# Patient Record
Sex: Male | Born: 1970 | Race: White | Hispanic: No | Marital: Single | Smoking: Never smoker
Health system: Southern US, Community
[De-identification: ages and names within clinical notes are randomized; demographics above are authoritative.]

## PROBLEM LIST (undated history)

## (undated) HISTORY — PX: APPENDECTOMY: SHX54

## (undated) HISTORY — PX: HERNIA REPAIR: SHX51

---

## 2009-09-13 HISTORY — PX: INGUINAL HERNIA REPAIR: SHX194

## 2011-09-16 ENCOUNTER — Emergency Department
Admission: EM | Admit: 2011-09-16 | Discharge: 2011-09-16 | Disposition: A | Discharge status: Home / Self Care | Payer: BC Managed Care – PPO | Source: Home / Self Care | Attending: Emergency Medicine | Admitting: Emergency Medicine

## 2011-09-16 ENCOUNTER — Encounter: Payer: Self-pay | Admitting: Emergency Medicine

## 2011-09-16 DIAGNOSIS — R0602 Shortness of breath: Secondary | ICD-10-CM

## 2011-09-16 DIAGNOSIS — J309 Allergic rhinitis, unspecified: Secondary | ICD-10-CM

## 2011-09-16 MED ORDER — ALBUTEROL SULFATE HFA 108 (90 BASE) MCG/ACT IN AERS: 1.0000 | INHALATION_SPRAY | Freq: Four times a day (QID) | RESPIRATORY_TRACT | Status: DC | PRN

## 2011-09-16 MED ORDER — PREDNISONE (PAK) 10 MG PO TABS: 10.0000 mg | ORAL_TABLET | Freq: Every day | ORAL | Status: AC

## 2011-09-16 MED ORDER — AZITHROMYCIN 250 MG PO TABS: ORAL_TABLET | ORAL | Status: AC

## 2011-09-16 NOTE — ED Notes (Signed)
SOB x 2days denies cough, cold does not smoke

## 2011-09-16 NOTE — ED Provider Notes (Addendum)
History     CSN: [id number]  Arrival date & time 09/16/11  1200   None     Chief Complaint  Patient presents with  . Shortness of Breath    (Consider location/radiation/quality/duration/timing/severity/associated sxs/prior treatment) HPI Johnny Ramirez is a 41 y.o. male who complains of onset of  shortness of breath for 2 days. He has a history of allergic rhinitis is not taking any medicine. He is not experiencing any type of chest pain, he is not a smoker, he does not have high blood pressure or any cardiac risk factors. He feels it is difficult to take a deep breath especially while laying down at night. Here in the office he is feeling pretty good.   No sore throat +/- cough No pleuritic pain No wheezing + nasal congestion + post-nasal drainage No sinus pain/pressure No chest congestion No itchy/red eyes No earache No hemoptysis + SOB No chills/sweats No fever No nausea No vomiting No abdominal pain No diarrhea No skin rashes No fatigue No myalgias No headache    History reviewed. No pertinent past medical history.  No past surgical history on file.  No family history on file.  History  Substance Use Topics  . Smoking status: Not on file  . Smokeless tobacco: Not on file  . Alcohol Use: Not on file    OB History    Grav Para Term Preterm Abortions TAB SAB Ect Mult Living                  Review of Systems  Allergies  Review of patient's allergies indicates no known allergies.  Home Medications   Current Outpatient Rx  Name Route Sig Dispense Refill  . ALBUTEROL SULFATE HFA 108 (90 BASE) MCG/ACT IN AERS Inhalation Inhale 1-2 puffs into the lungs every 6 (six) hours as needed for wheezing. 1 Inhaler 0  . AZITHROMYCIN 250 MG PO TABS  Use as directed 1 each 0  . PREDNISONE (PAK) 10 MG PO TABS Oral Take 1 tablet (10 mg total) by mouth daily. 6 day pack, use as directed, dispense 1 pack 1 tablet 0    BP 127/81  Pulse 70  Temp(Src) 98.7 F (37.1 C)  (Oral)  Resp 18  Ht 6' (1.829 m)  Wt 214 lb (97.07 kg)  BMI 29.02 kg/m2  SpO2 100%  Physical Exam  Nursing note and vitals reviewed. Constitutional: She is oriented to person, place, and time. She appears well-developed and well-nourished.  HENT:  Head: Normocephalic and atraumatic.  Right Ear: Tympanic membrane, external ear and ear canal normal.  Left Ear: Tympanic membrane, external ear and ear canal normal.  Nose: Mucosal edema and rhinorrhea present.  Mouth/Throat: Posterior oropharyngeal erythema (postnasal drip) present. No oropharyngeal exudate or posterior oropharyngeal edema.  Eyes: No scleral icterus.  Neck: Neck supple.  Cardiovascular: Regular rhythm and normal heart sounds.   Pulmonary/Chest: Effort normal and breath sounds normal. No respiratory distress.  Neurological: She is alert and oriented to person, place, and time.  Skin: Skin is warm and dry.  Psychiatric: She has a normal mood and affect. Her speech is normal.    ED Course  Procedures (including critical care time)  Labs Reviewed - No data to display No results found.   1. Shortness of breath   2. Allergic rhinitis, cause unspecified       MDM   Shortness of breath this and allergies. I do not see a cause of a cardiac or intrathoracic abnormalities go-ahead put him  on a prednisone pack as well as a Z-Pak for the next few days. I've also given a prescription for albuterol inhaler to use if needed he should also be taking some form of antihistamine for his allergies. If he is not improving or if he is getting any worse, the next step would be taking either chest x-ray and/or an EKG. He was given contact information for the primary care physician's next-door.  Lily Kocher, MD 09/16/11 1239  Lily Kocher, MD 09/16/11 2000

## 2011-09-27 ENCOUNTER — Emergency Department
Admission: EM | Admit: 2011-09-27 | Discharge: 2011-09-27 | Disposition: A | Discharge status: Home / Self Care | Payer: BC Managed Care – PPO | Source: Home / Self Care | Attending: Family Medicine | Admitting: Family Medicine

## 2011-09-27 ENCOUNTER — Emergency Department
Admit: 2011-09-27 | Discharge: 2011-09-27 | Disposition: A | Discharge status: Home / Self Care | Payer: BC Managed Care – PPO | Attending: Family Medicine | Admitting: Family Medicine

## 2011-09-27 DIAGNOSIS — M940 Chondrocostal junction syndrome [Tietze]: Secondary | ICD-10-CM

## 2011-09-27 NOTE — ED Provider Notes (Signed)
History     CSN: [id number]  Arrival date & time 09/27/11  1417   First MD Initiated Contact with Patient 09/27/11 1440      Chief Complaint  Patient presents with  . Shortness of Breath     HPI Comments: Patient reports that his sensation of dyspnea improved while taking prednisone, and now has recurred although not as severe as previously.  Interestingly, he notes that the sensation is not present when he is physically active.  He has had no recent cough or URI.  Over the past two days he has developed mild intermittent discomfort in his xiphoid area that is worse with movement and radiates to his back.  He recalls to trauma to his chest or significant change in physical activities.  Patient is a 41 y.o. male presenting with shortness of breath. The history is provided by the patient.  Shortness of Breath  The current episode started more than 2 weeks ago. The problem occurs occasionally. The problem has been gradually worsening. The problem is mild. Relieved by: activity. The symptoms are aggravated by nothing. Associated symptoms include shortness of breath. Pertinent negatives include no chest pain, no chest pressure, no orthopnea, no fever, no rhinorrhea, no sore throat, no stridor, no cough and no wheezing. There was no intake of a foreign body. She was not exposed to toxic fumes. Her past medical history does not include asthma, past wheezing, eczema or asthma in the family. She has been behaving normally.    No past medical history on file.  No past surgical history on file.  No family history on file.  History  Substance Use Topics  . Smoking status: Not on file  . Smokeless tobacco: Not on file  . Alcohol Use: Not on file    OB History    Grav Para Term Preterm Abortions TAB SAB Ect Mult Living                  Review of Systems  Constitutional: Negative for fever, chills, activity change, fatigue and unexpected weight change.  HENT: Negative for ear pain,  congestion, sore throat and rhinorrhea.   Eyes: Negative.   Respiratory: Positive for chest tightness and shortness of breath. Negative for cough, wheezing and stridor.   Cardiovascular: Negative for chest pain, palpitations, orthopnea and leg swelling.  Gastrointestinal: Negative.   Genitourinary: Negative.   Musculoskeletal: Negative.   Skin: Negative.   Neurological: Negative for headaches.    Allergies  Review of patient's allergies indicates not on file.  Home Medications   Current Outpatient Rx  Name Route Sig Dispense Refill  . ALBUTEROL SULFATE HFA 108 (90 BASE) MCG/ACT IN AERS Inhalation Inhale 1-2 puffs into the lungs every 6 (six) hours as needed for wheezing. 1 Inhaler 0  . PREDNISONE (PAK) 10 MG PO TABS Oral Take 1 tablet (10 mg total) by mouth daily. 6 day pack, use as directed, dispense 1 pack 1 tablet 0    BP 117/76  Pulse 75  Temp(Src) 98.6 F (37 C) (Oral)  Resp 16  Ht 6' (1.829 m)  Wt 210 lb (95.255 kg)  BMI 28.48 kg/m2  SpO2 99%  Physical Exam Nursing notes and Vital Signs reviewed. Appearance:  Patient appears healthy, stated age, and in no acute distress Eyes:  Pupils are equal, round, and reactive to light and accomodation.  Extraocular movement is intact.  Conjunctivae are not inflamed  Nose:  Mildly congested turbinates.  No sinus tenderness.   Pharynx:  Normal Neck:  Supple.  No adenopathy Lungs:  Clear to auscultation.  Breath sounds are equal.  Chest:  Mild tenderness over the xiphoid process, but no tenderness over sternum or pectoralis muscles. Heart:  Regular rate and rhythm without murmurs, rubs, or gallops.  Abdomen:  Nontender without masses or hepatosplenomegaly.  Bowel sounds are present.  No CVA or flank tenderness.  Extremities:  No edema.  No calf tenderness Skin:  No rash present.   ED Course  Procedures none  Labs Reviewed - No data to display Dg Chest 2 View  09/27/2011  *RADIOLOGY REPORT*  Clinical Data: Shortness of breath.   CHEST - 2 VIEW  Comparison: None.  Findings: Trachea is midline.  Heart size normal.  Minimal biapical pleural thickening.  Lungs are clear.  No pleural fluid.  IMPRESSION: No acute findings.  Original Report Authenticated By: Reyes Ivan, M.D.   EKG:  normal  1. Costochondritis, acute       MDM  Although chest is not tender to palpation today, suspect costochondritis partly improved with prednisone. May take Ibuprofen 200mg , 4 tabs every 8 hours with food for flareups.  Apply heating pad to sternum two or three times daily. Reassurance.  Given a Water quality scientist patient information and instruction sheet on topic costochondritis Followup with [facility] if not improving about two weeks.         Donna Christen, MD 09/27/11 [phone]

## 2011-09-27 NOTE — ED Notes (Signed)
Was here 2 weeks ago for same sx.  Was given anti biotics and steroids, was better while on the meds now "I feel like I can't get enough air"

## 2012-06-12 ENCOUNTER — Ambulatory Visit (INDEPENDENT_AMBULATORY_CARE_PROVIDER_SITE_OTHER): Payer: BC Managed Care – PPO | Admitting: Family Medicine

## 2012-06-12 ENCOUNTER — Encounter: Payer: Self-pay | Admitting: Family Medicine

## 2012-06-12 VITALS — BP 129/82 | HR 80 | Ht 72.0 in | Wt 215.0 lb

## 2012-06-12 DIAGNOSIS — Z0000 Encounter for general adult medical examination without abnormal findings: Secondary | ICD-10-CM

## 2012-06-12 NOTE — Patient Instructions (Signed)
Start a regular exercise program and make sure you are eating a healthy diet Try to eat 4 servings of dairy a day  Your vaccines are up to date.

## 2012-06-12 NOTE — Progress Notes (Signed)
Subjective:    Patient ID: Johnny Ramirez, male    DOB: Dec 09, 1970, 41 y.o.   MRN: [id number]  HPI Here to estab care.  No problems.  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  No regular exercise. No medical problems. Family history of diabetes. He has never had her cholesterol checked in his entire life. He says he would like to have a physical.   Review of Systems  Constitutional: Negative for fever, diaphoresis and unexpected weight change.  HENT: Negative for hearing loss, rhinorrhea, sneezing and tinnitus.   Eyes: Negative for visual disturbance.  Respiratory: Negative for cough and wheezing.   Cardiovascular: Negative for chest pain and palpitations.  Gastrointestinal: Negative for nausea, vomiting, diarrhea and blood in stool.  Genitourinary: Negative for dysuria and discharge.  Musculoskeletal: Negative for myalgias and arthralgias.  Skin: Negative for rash.  Neurological: Negative for headaches.  Hematological: Negative for adenopathy.  Psychiatric/Behavioral: Negative for disturbed wake/sleep cycle and dysphoric mood. The patient is not nervous/anxious.    BP 129/82  Pulse 80  Wt 215 lb (97.523 kg)    No Known Allergies  History reviewed. No pertinent past medical history.  Past Surgical History  Procedure Date  . Appendectomy     age 41  . Inguinal hernia repair     age 72    History   Social History  . Marital Status: N/A    Spouse Name: Isabelle Course     Number of Children: 3  . Years of Education: N/A   Occupational History  . Mechanic     [address]   Social History Main Topics  . Smoking status: Never Smoker   . Smokeless tobacco: Not on file  . Alcohol Use: 1.8 oz/week    3 Cans of beer per week  . Drug Use: No  . Sexually Active: Yes   Other Topics Concern  . Not on file   Social History Narrative   2 CAFFEINE per day.  No exercise.     Family History  Problem Relation Age of Onset  . Alcohol abuse Father   . Diabetes Father   . Bone cancer Father     . Heart disease Father     Outpatient Encounter Prescriptions as of 06/12/2012  Medication Sig Dispense Refill  . Multiple Vitamins-Minerals (MENS MULTI VITAMIN & MINERAL PO) Take 1 tablet by mouth daily.              Objective:   Physical Exam  Constitutional: He is oriented to person, place, and time. He appears well-developed and well-nourished.  HENT:  Head: Normocephalic and atraumatic.  Right Ear: External ear normal.  Left Ear: External ear normal.  Nose: Nose normal.  Mouth/Throat: Oropharynx is clear and moist.  Eyes: Conjunctivae normal and EOM are normal. Pupils are equal, round, and reactive to light.  Neck: Normal range of motion. Neck supple. No thyromegaly present.  Cardiovascular: Normal rate, regular rhythm, normal heart sounds and intact distal pulses.        RAdial 2+. No carotid bruits.   Pulmonary/Chest: Effort normal and breath sounds normal.  Abdominal: Soft. Bowel sounds are normal. He exhibits no distension and no mass. There is no tenderness. There is no rebound and no guarding.  Musculoskeletal: Normal range of motion.  Lymphadenopathy:    He has no cervical adenopathy.  Neurological: He is alert and oriented to person, place, and time. He has normal reflexes.  Skin: Skin is warm and dry.  Psychiatric: He has a  normal mood and affect. His behavior is normal. Judgment and thought content normal.          Assessment & Plan:  CPE -  Start a regular exercise program and make sure you are eating a healthy diet Try to eat 4 servings of dairy a day or take a calcium supplement (500mg  twice a day). Your vaccines are up to date.   Will do labwork for CMP and lipids.    Decline flu vaccine.

## 2012-06-13 ENCOUNTER — Encounter: Payer: Self-pay | Admitting: Emergency Medicine

## 2012-06-19 LAB — COMPLETE METABOLIC PANEL WITH GFR
ALT: 14 U/L (ref 0–53)
AST: 15 U/L (ref 0–37)
Alkaline Phosphatase: 51 U/L (ref 39–117)
Creat: 1.19 mg/dL (ref 0.50–1.35)
GFR, Est African American: 88 mL/min
Sodium: 140 mEq/L (ref 135–145)
Total Bilirubin: 0.6 mg/dL (ref 0.3–1.2)
Total Protein: 7.4 g/dL (ref 6.0–8.3)

## 2012-06-19 LAB — LIPID PANEL
HDL: 53 mg/dL (ref >39)
LDL Cholesterol: 100 mg/dL — ABNORMAL HIGH (ref 0–99)
Total CHOL/HDL Ratio: 3.2 Ratio
Triglycerides: 83 mg/dL (ref <150)
VLDL: 17 mg/dL (ref 0–40)

## 2012-06-19 NOTE — Progress Notes (Signed)
Quick Note:  All labs are normal. ______ 

## 2012-07-14 ENCOUNTER — Encounter: Payer: Self-pay | Admitting: Family Medicine

## 2013-07-23 ENCOUNTER — Ambulatory Visit (INDEPENDENT_AMBULATORY_CARE_PROVIDER_SITE_OTHER): Payer: BC Managed Care – PPO | Admitting: Family Medicine

## 2013-07-23 ENCOUNTER — Encounter: Payer: Self-pay | Admitting: Family Medicine

## 2013-07-23 VITALS — BP 118/78 | HR 71 | Ht 72.0 in | Wt 216.0 lb

## 2013-07-23 DIAGNOSIS — K219 Gastro-esophageal reflux disease without esophagitis: Secondary | ICD-10-CM

## 2013-07-23 DIAGNOSIS — Z23 Encounter for immunization: Secondary | ICD-10-CM

## 2013-07-23 DIAGNOSIS — Z0000 Encounter for general adult medical examination without abnormal findings: Secondary | ICD-10-CM

## 2013-07-23 DIAGNOSIS — R632 Polyphagia: Secondary | ICD-10-CM

## 2013-07-23 NOTE — Progress Notes (Signed)
Subjective:    Patient ID: Johnny Ramirez, male    DOB: Oct 02, 1970, 42 y.o.   MRN: [id number]  HPI  Increased apetite. Has gained some weigh. He is working all the time.  No snoring. Sleeps ok but wakes up frequently.  Has been having some neck pain.  No fam history of DM or thyroid .  No inc thirst or urination.  No depressive sxs.   Review of Systems Conference review systems is negative.  BP 118/78  Pulse 71  Ht 6' (1.829 m)  Wt 216 lb (97.977 kg)  BMI 29.29 kg/m2    No Known Allergies  No past medical history on file.  Past Surgical History  Procedure Laterality Date  . Hernia repair      as a child  . Inguinal hernia repair  2011  . Appendectomy      as a child    History   Social History  . Marital Status: Single    Spouse Name: N/A    Number of Children: N/A  . Years of Education: N/A   Occupational History  . Not on file.   Social History Main Topics  . Smoking status: Never Smoker   . Smokeless tobacco: Not on file  . Alcohol Use: 1.0 oz/week    2 drink(s) per week  . Drug Use: No  . Sexual Activity: Not on file   Other Topics Concern  . Not on file   Social History Narrative   No regular exercise.         Family History  Problem Relation Age of Onset  . Diabetes    . Cancer Father 77    Outpatient Encounter Prescriptions as of 07/23/2013  Medication Sig  . Multiple Vitamins-Minerals (MENS MULTI VITAMIN & MINERAL PO) Take 1 tablet by mouth daily.  . [DISCONTINUED] albuterol (PROVENTIL HFA;VENTOLIN HFA) 108 (90 BASE) MCG/ACT inhaler Inhale 1-2 puffs into the lungs every 6 (six) hours as needed for wheezing.          Objective:   Physical Exam  Constitutional: He is oriented to person, place, and time. He appears well-developed and well-nourished.  HENT:  Head: Normocephalic and atraumatic.  Right Ear: External ear normal.  Left Ear: External ear normal.  Nose: Nose normal.  Mouth/Throat: Oropharynx is clear and moist.  Eyes:  Conjunctivae and EOM are normal. Pupils are equal, round, and reactive to light.  Neck: Normal range of motion. Neck supple. No thyromegaly present.  Cardiovascular: Normal rate, regular rhythm, normal heart sounds and intact distal pulses.   Pulmonary/Chest: Effort normal and breath sounds normal.  Abdominal: Soft. Bowel sounds are normal. He exhibits no distension and no mass. There is no tenderness. There is no rebound and no guarding.  Musculoskeletal: Normal range of motion.  Lymphadenopathy:    He has no cervical adenopathy.  Neurological: He is alert and oriented to person, place, and time. He has normal reflexes.  Skin: Skin is warm and dry.  Psychiatric: He has a normal mood and affect. His behavior is normal. Judgment and thought content normal.          Assessment & Plan:  CPE - Keep up a regular exercise program and make sure you are eating a healthy diet Try to eat 4 servings of dairy a day, or if you are lactose intolerant take a calcium with vitamin D daily.  Your vaccines are up to date.   Inc appetite. Unclear etiology. We'll check for anemia, thyroid problems. We'll  also do a fasting glucose to rule out diabetes. Discusses sometimes increase the intake is related to wanting more energy and becomes a cycle.  GERD-he's been using Nexium occasionally. We discussed reflux diet. Handout given. If symptoms persist then please let me know. Can also use antacids for acute relief.

## 2013-07-23 NOTE — Patient Instructions (Signed)

## 2013-07-24 LAB — COMPLETE METABOLIC PANEL WITH GFR
ALT: 20 U/L (ref 0–53)
Alkaline Phosphatase: 56 U/L (ref 39–117)
CO2: 29 mEq/L (ref 19–32)
Potassium: 4.3 mEq/L (ref 3.5–5.3)
Sodium: 139 mEq/L (ref 135–145)
Total Bilirubin: 0.8 mg/dL (ref 0.3–1.2)
Total Protein: 7.9 g/dL (ref 6.0–8.3)

## 2013-07-24 LAB — CBC WITH DIFFERENTIAL/PLATELET
Basophils Absolute: 0.1 10*3/uL (ref 0.0–0.1)
Basophils Relative: 1 % (ref 0–1)
MCHC: 35.2 g/dL (ref 30.0–36.0)
Neutro Abs: 5.3 10*3/uL (ref 1.7–7.7)
Neutrophils Relative %: 58 % (ref 43–77)
Platelets: 306 10*3/uL (ref 150–400)
RDW: 13 % (ref 11.5–15.5)

## 2013-07-24 LAB — LIPID PANEL
HDL: 48 mg/dL (ref >39)
LDL Cholesterol: 96 mg/dL (ref 0–99)
Total CHOL/HDL Ratio: 3.4 Ratio

## 2013-07-24 LAB — TSH: TSH: 2.398 u[IU]/mL (ref 0.350–4.500)

## 2013-07-24 NOTE — Progress Notes (Signed)
Quick Note:  All labs are normal. ______ 

## 2013-09-30 ENCOUNTER — Emergency Department
Admission: EM | Admit: 2013-09-30 | Discharge: 2013-09-30 | Disposition: A | Discharge status: Home / Self Care | Payer: BC Managed Care – PPO | Source: Home / Self Care | Attending: Family Medicine | Admitting: Family Medicine

## 2013-09-30 ENCOUNTER — Encounter: Payer: Self-pay | Admitting: Emergency Medicine

## 2013-09-30 DIAGNOSIS — R0981 Nasal congestion: Secondary | ICD-10-CM

## 2013-09-30 DIAGNOSIS — J3489 Other specified disorders of nose and nasal sinuses: Secondary | ICD-10-CM

## 2013-09-30 MED ORDER — PREDNISONE 20 MG PO TABS: 20.0000 mg | ORAL_TABLET | Freq: Two times a day (BID) | ORAL | Status: DC

## 2013-09-30 MED ORDER — FLUTICASONE PROPIONATE 50 MCG/ACT NA SUSP: NASAL | Status: DC

## 2013-09-30 MED ORDER — AZITHROMYCIN 250 MG PO TABS: ORAL_TABLET | ORAL | Status: DC

## 2013-09-30 NOTE — ED Provider Notes (Signed)
CSN: [id number]     Arrival date & time 09/30/13  1102 History   First MD Initiated Contact with Patient 09/30/13 1116     Chief Complaint  Patient presents with  . Facial Pain    x 2 weeks     HPI Comments: Patient complains of sinus congestion and left facial and forehead pressure for about two weeks.  He has had increased post-nasal drainage.  No fevers, chills, and sweats.  He has a history of seasonal allergies that usually flare up in the spring.  He does not feel ill, and has no URI symptoms.  The history is provided by the patient.    History reviewed. No pertinent past medical history. Past Surgical History  Procedure Laterality Date  . Hernia repair      as a child  . Inguinal hernia repair  2011  . Appendectomy      as a child   Family History  Problem Relation Age of Onset  . Diabetes    . Cancer Father 11   History  Substance Use Topics  . Smoking status: Never Smoker   . Smokeless tobacco: Not on file  . Alcohol Use: 1.0 oz/week    2 drink(s) per week    Review of Systems No sore throat No cough No pleuritic pain No wheezing + nasal congestion + post-nasal drainage + sinus pain/pressure No itchy/red eyes No earache No hemoptysis No SOB No fever/chills No nausea No vomiting No abdominal pain No diarrhea No urinary symptoms No skin rash No fatigue No myalgias No headache Used OTC meds without relief  Allergies  Review of patient's allergies indicates no known allergies.  Home Medications   Current Outpatient Rx  Name  Route  Sig  Dispense  Refill  . Multiple Vitamins-Minerals (MENS MULTI VITAMIN & MINERAL PO)   Oral   Take 1 tablet by mouth daily.         Marland Kitchen azithromycin (ZITHROMAX Z-PAK) 250 MG tablet      Take 2 tabs today; then begin one tab once daily for 4 more days. (Rx void after 10/08/13)   6 each   0   . fluticasone (FLONASE) 50 MCG/ACT nasal spray      Place two sprays in each nostril once daily   16 g   1   .  predniSONE (DELTASONE) 20 MG tablet   Oral   Take 1 tablet (20 mg total) by mouth 2 (two) times daily. Take with food.   10 tablet   0    BP 111/74  Pulse 82  Temp(Src) 98.4 F (36.9 C) (Oral)  Ht 6' (1.829 m)  Wt 227 lb (102.967 kg)  BMI 30.78 kg/m2  SpO2 98% Physical Exam Nursing notes and Vital Signs reviewed. Appearance:  Patient appears healthy, stated age, and in no acute distress Eyes:  Pupils are equal, round, and reactive to light and accomodation.  Extraocular movement is intact.  Conjunctivae are not inflamed  Ears:  Canals normal.  Tympanic membranes normal.  Nose:  Congested turbinates.  No sinus tenderness.   Pharynx:  Normal; post-nasal drainage is noted Neck:  Supple.  No adenopathy Lungs:  Clear to auscultation.  Breath sounds are equal.  Heart:  Regular rate and rhythm without murmurs, rubs, or gallops.  Abdomen:  Nontender without masses or hepatosplenomegaly.  Bowel sounds are present.  No CVA or flank tenderness.  Extremities:  No edema.  Skin:  No rash present.   ED Course  Procedures  none       MDM   1. Nasal sinus congestion    Begin prednisone burst.  Begin Flonase nasal spray. Take plain Mucinex (1200 mg guaifenesin) twice daily for cough and congestion.  May add Sudafed for sinus congestion.   Increase fluid intake, rest. May use Afrin nasal spray (or generic oxymetazoline) twice daily for about 5 days.  Also recommend using saline nasal spray several times daily and saline nasal irrigation (AYR is a common brand).  Use Flonase spray after Afrin spray and saline rinse Begin Azithromycin if not improving about one week or if fever develops (Given a prescription to hold, with an expiration date)  Followup with Family Doctor if not improved in two weeks        Kandra Nicolas, MD 10/03/13 1038

## 2013-09-30 NOTE — Discharge Instructions (Signed)
Take plain Mucinex (1200 mg guaifenesin) twice daily for cough and congestion.  May add Sudafed for sinus congestion.   Increase fluid intake, rest. May use Afrin nasal spray (or generic oxymetazoline) twice daily for about 5 days.  Also recommend using saline nasal spray several times daily and saline nasal irrigation (AYR is a common brand).  Use Flonase spray after Afrin spray and saline rinse Begin Azithromycin if not improving about one week or if fever develops

## 2013-09-30 NOTE — ED Notes (Signed)
Arif complains of pressure in his face for 2 weeks. Denies cough, fevers, chills or sweats.

## 2014-06-03 ENCOUNTER — Encounter: Payer: Self-pay | Admitting: Physician Assistant

## 2014-06-03 ENCOUNTER — Ambulatory Visit (INDEPENDENT_AMBULATORY_CARE_PROVIDER_SITE_OTHER): Payer: BC Managed Care – PPO | Admitting: Physician Assistant

## 2014-06-03 VITALS — BP 137/69 | HR 94 | Ht 72.0 in | Wt 224.0 lb

## 2014-06-03 DIAGNOSIS — N509 Disorder of male genital organs, unspecified: Secondary | ICD-10-CM | POA: Diagnosis not present

## 2014-06-03 DIAGNOSIS — N50811 Right testicular pain: Secondary | ICD-10-CM

## 2014-06-03 NOTE — Progress Notes (Signed)
   Subjective:    Patient ID: Johnny Ramirez, male    DOB: 1970-09-19, 43 y.o.   MRN: [id number]  HPI Patient is a 43 yo male who presents to the clinic with on and off testicular pain for about 2 months. Pain is not bad but just annoying and will not go away. Denies any swelling, pain to touch, difficultly urinating, pain with sex, nausea or vomiting. Pain is present when pants pull to tight or just sitting the wrong way. No pain with urination. Seems like it is more right testicle than left. No pain with lifting or coughing. Not tried anything to make better. Hx of inguinal hernia bilateral but has had no pressure sensation as before. No penile discharge. No fever, chills, n/v/d.   Pt is sexually active with one person.      Review of Systems  All other systems reviewed and are negative.      Objective:   Physical Exam  Genitourinary: Penis normal.             Assessment & Plan:  Right sided testicular pain- reassured pt that did not appear to be anything structural. There was a small area posterior right testicle of varicosities.. Discussed normal finding and usually do not result in pain. No signs of hernia. No masses, tenderness. No signs of infection. Not cause identified today. Will get ultrasound. If normal suggest wear looser pants and/or consider boxers for looser fit.

## 2014-06-03 NOTE — Patient Instructions (Signed)
Varicocele A varicocele is a swelling of veins in the scrotum (the bag of skin that contains the testicles). It is most common in young men. It occurs most often on the left side. Small or painless varicoceles do not need treatment. Most often, this is not a serious problem, but further tests may be needed to confirm the diagnosis. Surgery may be needed if complications of varicoceles arise. Rarely, varicoceles can reoccur after surgery. CAUSES  The swelling is due to blood backing up in the vein that leads from the testicle back to the body. Blood backs up because the valves inside the vein are not working properly. Veins normally return blood to the heart. Valves in veins are supposed to be one-way valves. They should not allow blood to flow backwards. If the valves do not work well, blood can pool in a vein and make it swell. The same thing happens with varicose veins in the leg. SYMPTOMS  A varicocele most often causes no symptoms. When they occur, symptoms include:   Swelling on one side of the scrotum.  Swelling that is more obvious when standing up.  A lumpy feeling in the scrotum.  Heaviness on one side of the scrotum.  Dull ache in the scrotum, especially after exercise or prolonged standing or sitting.  Slower growth or reduced size of the testicle on the side of the varicocele (in young males).  Problems with fertility can arise if the testicle does not grow normally. DIAGNOSIS  Varicocele is usually diagnosed by a physical exam. Sometimes ultrasonography is done. TREATMENT  Usually, varicoceles need no treatment. They are often routinely monitored on exam by your caregiver to ensure they do not slow the growth of the testicle on that side. Treatment may be needed if:  The varicocele is large.  There is a lot of pain.  The varicocele causes a decrease in the size of the testicle in a growing adolescent.  The other testicle is absent or not normal.  Varicoceles are found on  both sides of the scrotum.  There is pain when exercising.  There are fertility problems. There are two types of treatment:  Surgery. The surgeon ties off the swollen veins. Surgery may be done with an incision in the skin or through a laparoscope. The surgery is usually done in an outpatient setting. Outpatient means there is no overnight stay in a hospital.  Embolization. A small tube is placed in a vein and guided into the swollen veins. X-rays are used to guide the small tube. Tiny metal coils or other blocking items are put through the tube. This blocks swollen veins and the flow of blood. This is usually done in an outpatient setting without the use of general anesthesia. HOME CARE INSTRUCTIONS  To decrease discomfort:  Wear supportive underwear.  Use an athletic supporter for sports.  Only take over-the-counter or prescription medicines for pain or discomfort as directed by your caregiver. SEEK MEDICAL CARE IF:   Pain is increasing.  Swelling does not decrease when lying down.  Testicle is smaller.  The testicle becomes enlarged, swollen, red, or painful. Document Released: 12/06/2000 Document Revised: 11/22/2011 Document Reviewed: 12/10/2009 ExitCare Patient Information 2015 [employer]. This information is not intended to replace advice given to you by your health care provider. Make sure you discuss any questions you have with your health care provider.  

## 2014-06-10 ENCOUNTER — Ambulatory Visit (INDEPENDENT_AMBULATORY_CARE_PROVIDER_SITE_OTHER): Payer: BC Managed Care – PPO

## 2014-06-10 ENCOUNTER — Other Ambulatory Visit: Payer: Self-pay | Admitting: Physician Assistant

## 2014-06-10 DIAGNOSIS — N433 Hydrocele, unspecified: Secondary | ICD-10-CM

## 2014-06-10 DIAGNOSIS — I861 Scrotal varices: Secondary | ICD-10-CM

## 2014-06-10 DIAGNOSIS — N50819 Testicular pain, unspecified: Secondary | ICD-10-CM

## 2014-06-10 DIAGNOSIS — N50811 Right testicular pain: Secondary | ICD-10-CM

## 2014-06-10 HISTORY — DX: Scrotal varices: I86.1

## 2015-09-11 ENCOUNTER — Encounter: Payer: Self-pay | Admitting: Family Medicine

## 2015-09-19 ENCOUNTER — Encounter: Payer: Self-pay | Admitting: Family Medicine

## 2015-09-19 ENCOUNTER — Ambulatory Visit (INDEPENDENT_AMBULATORY_CARE_PROVIDER_SITE_OTHER): Payer: BLUE CROSS/BLUE SHIELD | Admitting: Family Medicine

## 2015-09-19 VITALS — BP 131/72 | HR 69 | Temp 98.3°F | Resp 18 | Wt 231.7 lb

## 2015-09-19 DIAGNOSIS — R5383 Other fatigue: Secondary | ICD-10-CM

## 2015-09-19 DIAGNOSIS — G4700 Insomnia, unspecified: Secondary | ICD-10-CM

## 2015-09-19 DIAGNOSIS — Z0189 Encounter for other specified special examinations: Secondary | ICD-10-CM | POA: Diagnosis not present

## 2015-09-19 DIAGNOSIS — Z0000 Encounter for general adult medical examination without abnormal findings: Secondary | ICD-10-CM

## 2015-09-19 LAB — CBC WITH DIFFERENTIAL/PLATELET
BASOS PCT: 1 % (ref 0–1)
Basophils Absolute: 0.1 10*3/uL (ref 0.0–0.1)
EOS ABS: 0.1 10*3/uL (ref 0.0–0.7)
Eosinophils Relative: 1 % (ref 0–5)
HCT: 43.6 % (ref 39.0–52.0)
Hemoglobin: 14.9 g/dL (ref 13.0–17.0)
Lymphocytes Relative: 38 % (ref 12–46)
Lymphs Abs: 2.1 10*3/uL (ref 0.7–4.0)
MCH: 30.7 pg (ref 26.0–34.0)
MCHC: 34.2 g/dL (ref 30.0–36.0)
MCV: 89.7 fL (ref 78.0–100.0)
MONO ABS: 0.6 10*3/uL (ref 0.1–1.0)
MONOS PCT: 11 % (ref 3–12)
MPV: 10.3 fL (ref 8.6–12.4)
NEUTROS PCT: 49 % (ref 43–77)
Neutro Abs: 2.7 10*3/uL (ref 1.7–7.7)
PLATELETS: 270 10*3/uL (ref 150–400)
RBC: 4.86 MIL/uL (ref 4.22–5.81)
RDW: 12.9 % (ref 11.5–15.5)
WBC: 5.6 10*3/uL (ref 4.0–10.5)

## 2015-09-19 LAB — COMPLETE METABOLIC PANEL WITH GFR
ALBUMIN: 4.6 g/dL (ref 3.6–5.1)
ALK PHOS: 55 U/L (ref 40–115)
ALT: 27 U/L (ref 9–46)
AST: 21 U/L (ref 10–40)
BUN: 16 mg/dL (ref 7–25)
CO2: 27 mmol/L (ref 20–31)
Calcium: 9.4 mg/dL (ref 8.6–10.3)
Chloride: 103 mmol/L (ref 98–110)
Creat: 1.21 mg/dL (ref 0.60–1.35)
GFR, Est African American: 84 mL/min (ref >=60)
GFR, Est Non African American: 73 mL/min (ref >=60)
GLUCOSE: 99 mg/dL (ref 65–99)
POTASSIUM: 4.2 mmol/L (ref 3.5–5.3)
SODIUM: 139 mmol/L (ref 135–146)
TOTAL PROTEIN: 7.5 g/dL (ref 6.1–8.1)
Total Bilirubin: 0.7 mg/dL (ref 0.2–1.2)

## 2015-09-19 LAB — LIPID PANEL
CHOL/HDL RATIO: 3.3 ratio (ref <=5.0)
Cholesterol: 158 mg/dL (ref 125–200)
HDL: 48 mg/dL (ref >=40)
LDL Cholesterol: 101 mg/dL (ref <130)
Triglycerides: 44 mg/dL (ref <150)
VLDL: 9 mg/dL (ref <30)

## 2015-09-19 LAB — FERRITIN: FERRITIN: 201 ng/mL (ref 22–322)

## 2015-09-19 LAB — VITAMIN B12: VITAMIN B 12: 596 pg/mL (ref 211–911)

## 2015-09-19 LAB — TSH: TSH: 1.67 u[IU]/mL (ref 0.350–4.500)

## 2015-09-19 NOTE — Progress Notes (Signed)
Subjective:    Patient ID: Johnny Ramirez, male    DOB: 18-Sep-1970, 45 y.o.   MRN: [id number]  HPI  45 year old otherwise healthy male on no medications comes in today for complete physical exam. His job transferred him to [place] so he's been commuting an hour and a half each way and has been working 12 hour shifts. He typically gets up at 4:30 in the morning. Concerns today. He has not been exercising because of his work schedule.  For the last 6-7 months has felt more fatigued and more hungry. Having some sleep issues. Can fall asleep well but hard time staying asleep.  No night sweats or fever or chills.  No fam hx of thryoid.  No special diet. No hx of anemia. No blood loss. No blood in the stool. No swollen glands.   Review of Systems Conference of review of systems is negative except for those listed in the history of present illness.  BP 131/72 mmHg  Pulse 69  Temp(Src) 98.3 F (36.8 C) (Oral)  Resp 18  Wt 231 lb 11.2 oz (105.098 kg)  SpO2 100%    No Known Allergies  No past medical history on file.  Past Surgical History  Procedure Laterality Date  . Hernia repair      as a child  . Inguinal hernia repair  2011  . Appendectomy      as a child    Social History   Social History  . Marital Status: Single    Spouse Name: N/A  . Number of Children: N/A  . Years of Education: N/A   Occupational History  . Not on file.   Social History Main Topics  . Smoking status: Never Smoker   . Smokeless tobacco: Not on file  . Alcohol Use: 1.0 oz/week    2 drink(s) per week  . Drug Use: No  . Sexual Activity: Not on file   Other Topics Concern  . Not on file   Social History Narrative   No regular exercise.         Family History  Problem Relation Age of Onset  . Diabetes    . Cancer Father 82    Outpatient Encounter Prescriptions as of 09/19/2015  Medication Sig  . Multiple Vitamins-Minerals (MENS MULTI VITAMIN & MINERAL PO) Take 1 tablet by mouth  daily.   No facility-administered encounter medications on file as of 09/19/2015.          Objective:   Physical Exam  Constitutional: He is oriented to person, place, and time. He appears well-developed and well-nourished.  HENT:  Head: Normocephalic and atraumatic.  Right Ear: External ear normal.  Left Ear: External ear normal.  Nose: Nose normal.  Mouth/Throat: Oropharynx is clear and moist.  TMs and canals are clear.   Eyes: Conjunctivae and EOM are normal. Pupils are equal, round, and reactive to light.  Neck: Normal range of motion. Neck supple. No thyromegaly present.  Cardiovascular: Normal rate, regular rhythm, normal heart sounds and intact distal pulses.   No carotid bruits.   Pulmonary/Chest: Effort normal and breath sounds normal.  Abdominal: Soft. Bowel sounds are normal. He exhibits no distension and no mass. There is no tenderness. There is no rebound and no guarding.  Musculoskeletal: Normal range of motion.  Lymphadenopathy:    He has no cervical adenopathy.  Neurological: He is alert and oriented to person, place, and time. He has normal reflexes.  Skin: Skin is warm and dry.  Psychiatric:  He has a normal mood and affect. His behavior is normal. Judgment and thought content normal.          Assessment & Plan:  CPE Keep up a regular exercise program and make sure you are eating a healthy diet Try to eat 4 servings of dairy a day, or if you are lactose intolerant take a calcium with vitamin D daily.  Your vaccines are up to date.   Fatigue-it sounds like this may be related to recent change in sleep quality. We discussed sleep hygiene. He is not interested in taking any medications including over-the-counter medications as he's worried about sedation as he does have to drive for now half in the morning. We did discuss regular exercise to help with energy levels and to help reduce his weight. He has gained a few pounds in the last year as well. We'll just do  additional blood work to include thyroid and evaluation for anemia.  Insomnia-see note above. Discussed sleep hygiene.

## 2016-04-23 ENCOUNTER — Emergency Department
Admission: EM | Admit: 2016-04-23 | Discharge: 2016-04-23 | Disposition: A | Discharge status: Home / Self Care | Payer: BLUE CROSS/BLUE SHIELD | Source: Home / Self Care | Attending: Family Medicine | Admitting: Family Medicine

## 2016-04-23 ENCOUNTER — Emergency Department (INDEPENDENT_AMBULATORY_CARE_PROVIDER_SITE_OTHER): Payer: BLUE CROSS/BLUE SHIELD

## 2016-04-23 ENCOUNTER — Encounter: Payer: Self-pay | Admitting: Emergency Medicine

## 2016-04-23 DIAGNOSIS — R42 Dizziness and giddiness: Secondary | ICD-10-CM | POA: Diagnosis not present

## 2016-04-23 DIAGNOSIS — R0602 Shortness of breath: Secondary | ICD-10-CM | POA: Diagnosis not present

## 2016-04-23 MED ORDER — PREDNISONE 20 MG PO TABS: ORAL_TABLET | ORAL | 0 refills | Status: DC

## 2016-04-23 MED ORDER — ALBUTEROL SULFATE HFA 108 (90 BASE) MCG/ACT IN AERS: 1.0000 | INHALATION_SPRAY | Freq: Four times a day (QID) | RESPIRATORY_TRACT | 0 refills | Status: DC | PRN

## 2016-04-23 NOTE — ED Provider Notes (Signed)
CSN: [id number]     Arrival date & time 04/23/16  1412 History   First MD Initiated Contact with Patient 04/23/16 1423     Chief Complaint  Patient presents with  . Dizziness   (Consider location/radiation/quality/duration/timing/severity/associated sxs/prior Treatment) HPI  Johnny Ramirez is a 45 y.o. male presenting to UC with c/o intermittent episodes of dizziness with associated SOB and chest tightness for about 1 week.  Episodes last up to 20 minutes at a time.  Rest and slow deep breaths help with symptoms. No known triggers.  He reports occasionally feeling lightheaded and states "feels like bricks on my chest at times." no hx of CAD. Denies hx of asthma. No hx of anxiety. No recent URI. Denies cough or congestion. Denies hx of blood clots. No leg pain or swelling. No recent head injuries. No nausea or vomiting.   Per medical records, pt had hx of SOB in 2013, was treated with prednisone, azithromycin, and albuterol, symptoms initially resolved but then developed into costochondritis.  Pt has not had similar symptoms since. He has not tried anything for current symptoms as they are intermittent.  Denise chest pain, SOB, or dizziness at this time but did have symptoms earlier this morning. Pt reports normal annual exam in January of this year.    History reviewed. No pertinent past medical history. Past Surgical History:  Procedure Laterality Date  . APPENDECTOMY     as a child  . HERNIA REPAIR     as a child  . INGUINAL HERNIA REPAIR  2011   Family History  Problem Relation Age of Onset  . Diabetes    . Cancer Father 14   Social History  Substance Use Topics  . Smoking status: Never Smoker  . Smokeless tobacco: Never Used  . Alcohol use 4.8 oz/week    8 Standard drinks or equivalent per week    Review of Systems  Constitutional: Negative for chills, diaphoresis, fatigue and fever.  HENT: Negative for congestion, ear pain, sore throat, trouble swallowing and voice change.    Respiratory: Positive for chest tightness and shortness of breath. Negative for cough.   Cardiovascular: Negative for chest pain and palpitations.  Gastrointestinal: Negative for abdominal pain, diarrhea, nausea and vomiting.  Musculoskeletal: Negative for arthralgias, back pain, myalgias, neck pain and neck stiffness.  Skin: Negative for rash.  Neurological: Positive for dizziness and light-headedness. Negative for syncope, weakness and headaches.  All other systems reviewed and are negative.   Allergies  Review of patient's allergies indicates no known allergies.  Home Medications   Prior to Admission medications   Medication Sig Start Date End Date Taking? Authorizing Provider  albuterol (PROVENTIL HFA;VENTOLIN HFA) 108 (90 Base) MCG/ACT inhaler Inhale 1-2 puffs into the lungs every 6 (six) hours as needed for wheezing or shortness of breath. 04/23/16   Noland Fordyce, PA-C  Multiple Vitamins-Minerals (MENS MULTI VITAMIN & MINERAL PO) Take 1 tablet by mouth daily.    Historical Provider, MD  predniSONE (DELTASONE) 20 MG tablet 3 tabs po day one, then 2 po daily x 4 days 04/23/16   Noland Fordyce, PA-C   Meds Ordered and Administered this Visit  Medications - No data to display  BP 115/72 (BP Location: Right Arm)   Pulse 79   Temp 98.4 F (36.9 C) (Oral)   SpO2 98%  No data found.   Physical Exam  Constitutional: He is oriented to person, place, and time. He appears well-developed and well-nourished.  HENT:  Head: Normocephalic  and atraumatic.  Right Ear: Tympanic membrane normal. No middle ear effusion.  Left Ear: Tympanic membrane normal.  No middle ear effusion.  Nose: Nose normal.  Mouth/Throat: Uvula is midline, oropharynx is clear and moist and mucous membranes are normal.  Eyes: Conjunctivae and EOM are normal. Pupils are equal, round, and reactive to light. No scleral icterus.  Neck: Normal range of motion. Neck supple.  Cardiovascular: Normal rate, regular rhythm and  normal heart sounds.   Pulmonary/Chest: Effort normal and breath sounds normal. No respiratory distress. He has no wheezes. He has no rales. He exhibits no tenderness.  No respiratory distress, able to speak in full sentences w/o difficulty. Lungs: CTAB. No chest wall tenderness.   Musculoskeletal: Normal range of motion.  Neurological: He is alert and oriented to person, place, and time.  CN II-XII in tact. Speech is clear. Alert to person, place and time. Normal finger to nose coordination.  Normal gait.       Skin: Skin is warm and dry.  Nursing note and vitals reviewed.   Urgent Care Course   Clinical Course    Procedures (including critical care time)  Labs Review Labs Reviewed  BASIC METABOLIC PANEL  TSH  POCT CBC W AUTO DIFF ([facility])    Imaging Review Dg Chest 2 View  Result Date: 04/23/2016 CLINICAL DATA:  45 year old male with a history of shortness of breath and dizziness EXAM: CHEST  2 VIEW COMPARISON:  09/27/2011 FINDINGS: The heart size and mediastinal contours are within normal limits. Both lungs are clear. The visualized skeletal structures are unremarkable. IMPRESSION: No active cardiopulmonary disease. Signed, Dulcy Fanny. Earleen Newport, DO [facility] Radiology Electronically Signed   By: Corrie Mckusick D.O.   On: 04/23/2016 15:00      MDM   1. Dizziness   2. Shortness of breath    Pt c/o dizziness and shortness of breath intermittently for about 1 week. Vitals: WNL.  PERC negative. Hx of similar symptoms in 2013 w/o known cause.   Symptoms do not sound cardiac in nature at this time. Possible underlying anxiety?  CXR: no active cardiopulmonary disease. Labs: CBC- WNL, no evidence of anemia. BMP and TSH pending.  Will try course of prednisone and albuterol inhaler, if symptoms not improving, encouraged f/u with PCP next week. Discussed symptoms that warrant emergent care in the ED. Patient verbalized  understanding and agreement with treatment plan.    Noland Fordyce, PA-C 04/23/16 1535

## 2016-04-23 NOTE — ED Triage Notes (Signed)
Pt c/o intermittent dizziness and SOB x1 week. States episodes are random and last about 20 minutes most times. C/o occasional lightheadedness and "feels like bricks are sitting on my chest"

## 2016-04-24 ENCOUNTER — Telehealth: Payer: Self-pay | Admitting: Emergency Medicine

## 2016-04-24 LAB — BASIC METABOLIC PANEL
BUN: 17 mg/dL (ref 7–25)
CO2: 25 mmol/L (ref 20–31)
Calcium: 10 mg/dL (ref 8.6–10.3)
Chloride: 105 mmol/L (ref 98–110)
Creat: 1.35 mg/dL (ref 0.60–1.35)
Glucose, Bld: 85 mg/dL (ref 65–99)
Potassium: 4.1 mmol/L (ref 3.5–5.3)
Sodium: 139 mmol/L (ref 135–146)

## 2016-04-24 LAB — TSH: TSH: 1.83 mIU/L (ref 0.40–4.50)

## 2016-11-04 ENCOUNTER — Telehealth: Payer: Self-pay | Admitting: Family Medicine

## 2016-11-04 ENCOUNTER — Other Ambulatory Visit: Payer: Self-pay | Admitting: *Deleted

## 2016-11-04 DIAGNOSIS — Z0000 Encounter for general adult medical examination without abnormal findings: Secondary | ICD-10-CM

## 2016-11-04 NOTE — Telephone Encounter (Signed)
Labs faxed, lvm informing pt.Johnny Ramirez [place]

## 2016-11-04 NOTE — Telephone Encounter (Signed)
Pt called. He has cpe appointment for tomorrow at 10:15 and  He would like lab order sent down tomorrow morning before his appointment.

## 2016-11-05 ENCOUNTER — Ambulatory Visit (INDEPENDENT_AMBULATORY_CARE_PROVIDER_SITE_OTHER): Payer: BLUE CROSS/BLUE SHIELD | Admitting: Family Medicine

## 2016-11-05 ENCOUNTER — Encounter: Payer: Self-pay | Admitting: Family Medicine

## 2016-11-05 VITALS — BP 129/73 | HR 85 | Ht 72.0 in | Wt 234.0 lb

## 2016-11-05 DIAGNOSIS — Z0000 Encounter for general adult medical examination without abnormal findings: Secondary | ICD-10-CM | POA: Diagnosis not present

## 2016-11-05 DIAGNOSIS — Z6831 Body mass index (BMI) 31.0-31.9, adult: Secondary | ICD-10-CM | POA: Diagnosis not present

## 2016-11-05 LAB — LIPID PANEL W/REFLEX DIRECT LDL
CHOLESTEROL: 182 mg/dL (ref <200)
HDL: 48 mg/dL (ref >40)
LDL-CHOLESTEROL: 115 mg/dL — AB
Non-HDL Cholesterol (Calc): 134 mg/dL — ABNORMAL HIGH (ref <130)
TRIGLYCERIDES: 86 mg/dL (ref <150)
Total CHOL/HDL Ratio: 3.8 Ratio (ref <5.0)

## 2016-11-05 LAB — CBC WITH DIFFERENTIAL/PLATELET
Basophils Absolute: 69 cells/uL (ref 0–200)
Basophils Relative: 1 %
EOS PCT: 1 %
Eosinophils Absolute: 69 cells/uL (ref 15–500)
HEMATOCRIT: 43.4 % (ref 38.5–50.0)
Hemoglobin: 14.8 g/dL (ref 13.2–17.1)
LYMPHS PCT: 42 %
Lymphs Abs: 2898 cells/uL (ref 850–3900)
MCH: 31 pg (ref 27.0–33.0)
MCHC: 34.1 g/dL (ref 32.0–36.0)
MCV: 91 fL (ref 80.0–100.0)
MONO ABS: 552 {cells}/uL (ref 200–950)
MONOS PCT: 8 %
MPV: 10.4 fL (ref 7.5–12.5)
NEUTROS PCT: 48 %
Neutro Abs: 3312 cells/uL (ref 1500–7800)
PLATELETS: 263 10*3/uL (ref 140–400)
RBC: 4.77 MIL/uL (ref 4.20–5.80)
RDW: 13.1 % (ref 11.0–15.0)
WBC: 6.9 10*3/uL (ref 3.8–10.8)

## 2016-11-05 LAB — COMPLETE METABOLIC PANEL WITH GFR
ALT: 21 U/L (ref 9–46)
AST: 18 U/L (ref 10–40)
Albumin: 4.5 g/dL (ref 3.6–5.1)
Alkaline Phosphatase: 57 U/L (ref 40–115)
BUN: 19 mg/dL (ref 7–25)
CALCIUM: 9.4 mg/dL (ref 8.6–10.3)
CO2: 26 mmol/L (ref 20–31)
Chloride: 105 mmol/L (ref 98–110)
Creat: 1.3 mg/dL (ref 0.60–1.35)
GFR, Est African American: 76 mL/min (ref >=60)
GFR, Est Non African American: 66 mL/min (ref >=60)
Glucose, Bld: 104 mg/dL — ABNORMAL HIGH (ref 65–99)
POTASSIUM: 4.1 mmol/L (ref 3.5–5.3)
Sodium: 139 mmol/L (ref 135–146)
Total Bilirubin: 0.6 mg/dL (ref 0.2–1.2)
Total Protein: 7.8 g/dL (ref 6.1–8.1)

## 2016-11-05 NOTE — Patient Instructions (Signed)
Keep up a regular exercise program and make sure you are eating a healthy diet Try to eat 4 servings of dairy a day, or if you are lactose intolerant take a calcium with vitamin D daily.  Your vaccines are up to date.   

## 2016-11-05 NOTE — Progress Notes (Signed)
Subjective:    Patient ID: Johnny Ramirez, male    DOB: December 03, 1970, 46 y.o.   MRN: [id number]  HPI 46 year old male here today for complete physical exam. Currently takes a multivitamin. He does exercise on the weekends but has a hard time doing it during the week. He says he's had a very high appetite lately and says that even after 2 hours of eating he just want to eat again. Wanted to discuss that today.  Review of Systems  BP 129/73   Pulse 85   Ht 6' (1.829 m)   Wt 234 lb (106.1 kg)   SpO2 99%   BMI 31.74 kg/m     No Known Allergies  No past medical history on file.  Past Surgical History:  Procedure Laterality Date  . APPENDECTOMY     as a child  . HERNIA REPAIR     as a child  . INGUINAL HERNIA REPAIR  2011    Social History   Social History  . Marital status: Single    Spouse name: N/A  . Number of children: N/A  . Years of education: N/A   Occupational History  . Not on file.   Social History Main Topics  . Smoking status: Never Smoker  . Smokeless tobacco: Never Used  . Alcohol use 4.8 oz/week    8 Standard drinks or equivalent per week  . Drug use: No  . Sexual activity: Not on file   Other Topics Concern  . Not on file   Social History Narrative   No regular exercise.         Family History  Problem Relation Age of Onset  . Diabetes    . Cancer Father 3    Outpatient Encounter Prescriptions as of 11/05/2016  Medication Sig  . Multiple Vitamins-Minerals (MENS MULTI VITAMIN & MINERAL PO) Take 1 tablet by mouth daily.  . [DISCONTINUED] albuterol (PROVENTIL HFA;VENTOLIN HFA) 108 (90 Base) MCG/ACT inhaler Inhale 1-2 puffs into the lungs every 6 (six) hours as needed for wheezing or shortness of breath.  . [DISCONTINUED] predniSONE (DELTASONE) 20 MG tablet 3 tabs po day one, then 2 po daily x 4 days   No facility-administered encounter medications on file as of 11/05/2016.          Objective:   Physical Exam  Constitutional: He is  oriented to person, place, and time. He appears well-developed and well-nourished.  HENT:  Head: Normocephalic and atraumatic.  Right Ear: External ear normal.  Left Ear: External ear normal.  Nose: Nose normal.  Mouth/Throat: Oropharynx is clear and moist.  Eyes: Conjunctivae and EOM are normal. Pupils are equal, round, and reactive to light.  Neck: Normal range of motion. Neck supple. No thyromegaly present.  Cardiovascular: Normal rate, regular rhythm, normal heart sounds and intact distal pulses.   Pulmonary/Chest: Effort normal and breath sounds normal.  Abdominal: Soft. Bowel sounds are normal. He exhibits no distension and no mass. There is no tenderness. There is no rebound and no guarding.  Musculoskeletal: Normal range of motion.  Lymphadenopathy:    He has no cervical adenopathy.  Neurological: He is alert and oriented to person, place, and time. He has normal reflexes.  Skin: Skin is warm and dry.  Psychiatric: He has a normal mood and affect. His behavior is normal. Judgment and thought content normal.        Assessment & Plan:  CPE Keep up a regular exercise program and make sure you are eating a  healthy diet Try to eat 4 servings of dairy a day, or if you are lactose intolerant take a calcium with vitamin D daily.  Your vaccines are up to date.  He actually went for his lab work today through should have recommending can give him a call at that point in time.  Nutrition-had a discussion around strategies for nutrition. I think certainly fine to eat snacks during the day as long as they're healthy things like a couple yogurt a piece of fruit etc. Also discussed may be taking his lunch and dinner meals and actually spreading some of those things out over the day. Consider trying to eat all at once.

## 2016-11-05 NOTE — Progress Notes (Signed)
Patient ID: Johnny Ramirez, male   DOB: [birth year], 46 y.o.   MRN: [id number]

## 2016-11-06 LAB — FERRITIN: FERRITIN: 167 ng/mL (ref 20–380)

## 2017-03-18 ENCOUNTER — Emergency Department
Admission: EM | Admit: 2017-03-18 | Discharge: 2017-03-18 | Disposition: A | Discharge status: Home / Self Care | Payer: BLUE CROSS/BLUE SHIELD | Source: Home / Self Care | Attending: Family Medicine | Admitting: Family Medicine

## 2017-03-18 ENCOUNTER — Other Ambulatory Visit: Payer: Self-pay

## 2017-03-18 ENCOUNTER — Emergency Department (INDEPENDENT_AMBULATORY_CARE_PROVIDER_SITE_OTHER): Payer: BLUE CROSS/BLUE SHIELD

## 2017-03-18 DIAGNOSIS — R0789 Other chest pain: Secondary | ICD-10-CM

## 2017-03-18 DIAGNOSIS — R002 Palpitations: Secondary | ICD-10-CM | POA: Diagnosis not present

## 2017-03-18 LAB — T4, FREE: FREE T4: 0.9 ng/dL (ref 0.8–1.8)

## 2017-03-18 LAB — TSH: TSH: 1.84 m[IU]/L (ref 0.40–4.50)

## 2017-03-18 NOTE — ED Provider Notes (Signed)
[name]    CSN: [id number] Arrival date & time: 03/18/17  9381     History   Chief Complaint Chief Complaint  Patient presents with  . Chest Pain    HPI Johnny Ramirez is a 46 y.o. male.   Patient complains of one week history of brief episodes of tightness in his anterior chest that radiates to his back.  The pain only occurs at night and often awakens him.  The symptoms do not occur when he is physically active.  He occasionally has a vague paresthesia radiating into his left arm.  He sometimes notices a rapid heart rate.  No shortness of breath.  No nausea/vomiting. Family history:  Father has had two cardiac stents.   The history is provided by the patient.  Chest Pain  Pain location:  Substernal area Pain quality: tightness   Radiates to: back. Pain severity:  Mild Onset quality:  Sudden Timing:  Sporadic Progression:  Unchanged Chronicity:  New Context: at rest   Relieved by:  None tried Worsened by:  Nothing Ineffective treatments:  None tried Associated symptoms: dizziness and shortness of breath   Associated symptoms: no abdominal pain, no AICD problem, no anorexia, no back pain, no claudication, no cough, no diaphoresis, no dysphagia, no fatigue, no fever, no headache, no heartburn, no lower extremity edema, no nausea, no near-syncope, no numbness, no palpitations, no PND, no syncope, no vomiting and no weakness   Risk factors: male sex     No past medical history on file.  Patient Active Problem List   Diagnosis Date Noted  . Wellness examination 11/05/2016  . Varicocele 06/10/2014    Past Surgical History:  Procedure Laterality Date  . APPENDECTOMY     as a child  . HERNIA REPAIR     as a child  . INGUINAL HERNIA REPAIR  2011       Home Medications    Prior to Admission medications   Medication Sig Start Date End Date Taking? Authorizing Provider  Multiple Vitamins-Minerals (MENS MULTI VITAMIN & MINERAL PO) Take 1 tablet by  mouth daily.    [provider]    Family History Family History  Problem Relation Age of Onset  . Diabetes Unknown   . Cancer Father 5    Social History Social History  Substance Use Topics  . Smoking status: Never Smoker  . Smokeless tobacco: Never Used  . Alcohol use 4.8 oz/week    8 Standard drinks or equivalent per week     Allergies   Patient has no known allergies.   Review of Systems Review of Systems  Constitutional: Negative for diaphoresis, fatigue and fever.  HENT: Negative for trouble swallowing.   Respiratory: Positive for shortness of breath. Negative for cough.   Cardiovascular: Positive for chest pain. Negative for palpitations, claudication, syncope, PND and near-syncope.  Gastrointestinal: Negative for abdominal pain, anorexia, heartburn, nausea and vomiting.  Musculoskeletal: Negative for back pain.  Neurological: Positive for dizziness. Negative for weakness, numbness and headaches.  All other systems reviewed and are negative.    Physical Exam Triage Vital Signs ED Triage Vitals  Enc Vitals Group     BP 03/18/17 0925 113/79     Pulse Rate 03/18/17 0925 67     Resp 03/18/17 0925 18     Temp 03/18/17 0925 98.8 F (37.1 C)     Temp Source 03/18/17 0925 Oral     SpO2 03/18/17 0925 98 %     Weight  03/18/17 0926 218 lb 4 oz (99 kg)     Height 03/18/17 0926 6' (1.829 m)     Head Circumference --      Peak Flow --      Pain Score 03/18/17 0927 0     Pain Loc --      Pain Edu? --      Excl. in [place]? --    No data found.   Updated Vital Signs BP 113/79 (BP Location: Left Arm)   Pulse 67   Temp 98.8 F (37.1 C) (Oral)   Resp 18   Ht 6' (1.829 m)   Wt 218 lb 4 oz (99 kg)   SpO2 98%   BMI 29.60 kg/m   Visual Acuity Right Eye Distance:   Left Eye Distance:   Bilateral Distance:    Right Eye Near:   Left Eye Near:    Bilateral Near:     Physical Exam Nursing notes and Vital Signs reviewed. Appearance:  Patient appears  stated age, and in no acute distress Eyes:  Pupils are equal, round, and reactive to light and accomodation.  Extraocular movement is intact.  Conjunctivae are not inflamed  Ears:  Canals normal.  Tympanic membranes normal.  Nose:  Normal turbinates.  No sinus tenderness.    Pharynx:  Normal Neck:  Supple.  No adenopathy or thyromegaly..   Lungs:  Clear to auscultation.  Breath sounds are equal.  Moving air well. Chest:  Nontender. Heart:  Regular rate and rhythm without murmurs, rubs, or gallops.  Abdomen:  Nontender without masses or hepatosplenomegaly.  Bowel sounds are present.  No CVA or flank tenderness.  Extremities:  No edema.  Skin:  Warm and dry; no rash present.    UC Treatments / Results  Labs (all labs ordered are listed, but only abnormal results are displayed) Labs Reviewed  TSH  T4, FREE  T3    EKG  EKG Interpretation   Rate:  66 BPM PR:  132 msec QT:  386 msec QTcH:  404 msec QRSD:  90 msec QRS axis:  23 degrees Interpretation:  Normal sinus rhythm; within normal limits       Radiology Dg Chest 2 View  Result Date: 03/18/2017 CLINICAL DATA:  Anterior chest pain for 1 week, no known injury, initial encounter EXAM: CHEST  2 VIEW COMPARISON:  04/23/2016 FINDINGS: The heart size and mediastinal contours are within normal limits. Both lungs are clear. The visualized skeletal structures are unremarkable. IMPRESSION: No active cardiopulmonary disease. Electronically Signed   By: Inez Catalina M.D.   On: 03/18/2017 10:27    Procedures Procedures (including critical care time)  Medications Ordered in UC Medications - No data to display   Initial Impression / Assessment and Plan / UC Course  I have reviewed the triage vital signs and the nursing notes.  Pertinent labs & imaging results that were available during my care of the patient were reviewed by me and considered in my medical decision making (see chart for details).    Normal EKG and chest X-ray  reassuring TSH, free T4, T3 pending. Followup with cardiologist for further evaluation.    Final Clinical Impressions(s) / UC Diagnoses   Final diagnoses:  Palpitations    New Prescriptions New Prescriptions   No medications on file     Kandra Nicolas, MD 03/23/17 [phone]

## 2017-03-18 NOTE — ED Triage Notes (Signed)
Pt states he started having chest tightness that went though to his back starting 2 days ago. Also states having a sensation going down left arm as well as "butterfly" feeling across his chest. Has had some SOB and dizziness as well.

## 2017-03-19 ENCOUNTER — Telehealth: Payer: Self-pay | Admitting: Emergency Medicine

## 2017-03-19 LAB — T3: T3 TOTAL: 99.1 ng/dL (ref 76–181)

## 2017-03-19 NOTE — Telephone Encounter (Signed)
Patient informed of Normal Lab Results. AP, CMA

## 2017-03-21 ENCOUNTER — Ambulatory Visit (INDEPENDENT_AMBULATORY_CARE_PROVIDER_SITE_OTHER): Payer: BLUE CROSS/BLUE SHIELD | Admitting: Internal Medicine

## 2017-03-21 ENCOUNTER — Encounter: Payer: Self-pay | Admitting: Internal Medicine

## 2017-03-21 VITALS — BP 140/86 | HR 94 | Ht 72.0 in | Wt 224.0 lb

## 2017-03-21 DIAGNOSIS — R002 Palpitations: Secondary | ICD-10-CM

## 2017-03-21 DIAGNOSIS — R0789 Other chest pain: Secondary | ICD-10-CM | POA: Diagnosis not present

## 2017-03-21 NOTE — Progress Notes (Signed)
Cardiology Office Note   Date:  03/21/2017   ID:  Johnny Ramirez, DOB June 04, 1971, MRN [id number]  PCP:  Hali Marry, MD  Cardiologist:   Dorris Carnes, MD   Pt referred by Dr Assunta Found from [facility] for chest tightness     History of Present Illness: Johnny Ramirez is a 46 y.o. male with a history of cheset tightness  Seen in [facility] ED on 7/6  Discomfort went to back  Started 2 days prior to ED visit  Also radiated to L arm  Felt like a "butterfly" sensation across chest  Some dizziness   Pressure pushing front, back and sides   First day was Week ago Monday  Sleeping at time  Never happens during day  Hard to braethHeart was racing at the time   Thursday night recurred  Arm went numb  Lasted 5 or 10 min   Heart racing quit but SOB stays until gets up  And get moving  No spells with activity No spells the last 3 mornings   Heart will flutter occasionally  No associated dizziness Seen in ED on 04/23/16 for dizziness, SOB and chest tightness     No outpatient prescriptions have been marked as taking for the 03/21/17 encounter (Office Visit) with Fay Records, MD.     Allergies:   Patient has no known allergies.   History reviewed. No pertinent past medical history.  Past Surgical History:  Procedure Laterality Date  . APPENDECTOMY     as a child  . HERNIA REPAIR     as a child  . INGUINAL HERNIA REPAIR  2011     Social History:  The patient  reports that he has never smoked. He has never used smokeless tobacco. He reports that he drinks about 4.8 oz of alcohol per week . He reports that he does not use drugs.   Family History:  The patient's family history includes Cancer (age of onset: 61) in his father.    ROS:  Please see the history of present illness. All other systems are reviewed and  Negative to the above problem except as noted.    PHYSICAL EXAM: VS:  BP 140/86   Pulse 94   Ht 6' (1.829 m)   Wt 101.6 kg (224 lb)   SpO2 97%   BMI 30.38 kg/m     GEN: Well nourished, well developed, in no acute distress  HEENT: normal  Neck: no JVD, carotid bruits, or masses Cardiac: RRR; no murmurs, rubs, or gallops,no edema  Respiratory:  clear to auscultation bilaterally, normal work of breathing GI: soft, nontender, nondistended, + BS  No hepatomegaly  MS: no deformity Moving all extremities   Skin: warm and dry, no rash Neuro:  Strength and sensation are intact Psych: euthymic mood, full affect   EKG:  EKG is not ordered today.  Sinus rhythm  In ED     Lipid Panel    Component Value Date/Time   CHOL 182 11/04/2016 0827   TRIG 86 11/04/2016 0827   HDL 48 11/04/2016 0827   CHOLHDL 3.8 11/04/2016 0827   VLDL 9 09/19/2015 0934   LDLCALC 101 09/19/2015 0934      Wt Readings from Last 3 Encounters:  03/21/17 101.6 kg (224 lb)  03/18/17 99 kg (218 lb 4 oz)  11/05/16 106.1 kg (234 lb)      ASSESSMENT AND PLAN:  1  Chest discomfort.  Very atypical I do not think spells represent  angina  Not associated with activity  ? GI  Reocmm trial of PPI For a couple weeks   Discuss with primary MD   I encouraged him to stay active    2  Palpitations.  I am not convinced of an arrhythmia  Would follow  Stay hydrated   If recurs consider event monitor    No f/u scheduled  PT to contact office if palpitations recur, worsen  Current medicines are reviewed at length with the patient today.  The patient does not have concerns regarding medicines.  Signed, Dorris Carnes, MD  03/21/2017 2:41 PM    [address], [place], [place]  [address] Phone: [phone]; Fax: [phone]

## 2017-03-21 NOTE — Patient Instructions (Signed)
  Take acid inhibitor (like Pepcid) at night to  treat for possible reflux.  Stop and follow after a couple wks   Call/email if symptoms recur (chest pressure, heart racing). WIll set up further testing at that time  No restrictions to activities

## 2018-09-11 ENCOUNTER — Emergency Department
Admission: EM | Admit: 2018-09-11 | Discharge: 2018-09-11 | Disposition: A | Discharge status: Home / Self Care | Payer: BLUE CROSS/BLUE SHIELD | Source: Home / Self Care | Attending: Emergency Medicine | Admitting: Emergency Medicine

## 2018-09-11 ENCOUNTER — Emergency Department (INDEPENDENT_AMBULATORY_CARE_PROVIDER_SITE_OTHER): Payer: BLUE CROSS/BLUE SHIELD

## 2018-09-11 ENCOUNTER — Other Ambulatory Visit: Payer: Self-pay

## 2018-09-11 DIAGNOSIS — M50323 Other cervical disc degeneration at C6-C7 level: Secondary | ICD-10-CM

## 2018-09-11 DIAGNOSIS — R0602 Shortness of breath: Secondary | ICD-10-CM | POA: Diagnosis not present

## 2018-09-11 DIAGNOSIS — R0989 Other specified symptoms and signs involving the circulatory and respiratory systems: Secondary | ICD-10-CM

## 2018-09-11 DIAGNOSIS — M50322 Other cervical disc degeneration at C5-C6 level: Secondary | ICD-10-CM

## 2018-09-11 DIAGNOSIS — F410 Panic disorder [episodic paroxysmal anxiety] without agoraphobia: Secondary | ICD-10-CM

## 2018-09-11 DIAGNOSIS — R198 Other specified symptoms and signs involving the digestive system and abdomen: Secondary | ICD-10-CM

## 2018-09-11 NOTE — Discharge Instructions (Addendum)
Chest x-ray and neck x-ray within normal limits. No evidence for heart or lung or neck abnormality as cause for your symptoms. Please read attached instruction sheets on globus and panic attacks. For now, I am not treating with any medication, as that needs to be discussed and treated by Dr. Charise Carwin your PCP.  Keep appointment for your physical on January 10.-Try to get a sooner appointment just to address the above symptoms.

## 2018-09-11 NOTE — ED Triage Notes (Signed)
Pt states that he has been seen for this problem previously.  Has been going on for several months but has gradually become worse.  Last night dizzy when lying down to the point he thought he was going to pass out.  Feels like he can not get enough air.

## 2018-09-11 NOTE — ED Provider Notes (Addendum)
[facility]    CSN: [id number] Arrival date & time: 09/11/18  0859     History   Chief Complaint Chief Complaint  Patient presents with  . Dizziness  . Shortness of Breath    HPI Johnny Ramirez is a 47 y.o. male.   HPI Pt states that he has been seen for this problem previously.  Has been going on for several months but has gradually become worse.  Last night felt dizzy, without vertigo, when lying down to the point he thought he was going to pass out.  Feels like he can not get enough air.-But no syncope or seizure.  Here with daughter who is an Therapist, sports. For months or longer he has vague symptoms of shortness of breath without chest pain.  Last night he felt he was short of breath at rest and breathing fast and felt anxious and lightheaded without syncope or focal neurologic symptoms, except his fingers felt tingly bilaterally.  He felt as if he was going to pass out, but he did not pass out.  He feels that lasted for many hours last night and he called his daughter who came over to his house to check him.  She states her BP was 142/100 at that time.  Constellation of symptoms resolved at 4 AM except he still feels chronically short of breath.  No cough or URI symptoms.  No fever or chills or nausea or vomiting.   I reviewed past medical history in EMR:  Seen in urgent care 03/2017 with palpitations with negative evaluation and work-up including normal TSH and T4 and CBC and CMP in 2018. Was seen for an episode of dizziness 04/2016 which resolved. Saw Dr. Dorris Carnes, MD, cardiologist in 2018 for follow-up after the July 2018 episode of symptoms.  Dr. Harrington Challenger felt EKG was normal and no sign of arrhythmia and felt he did not need further cardiology work-up.  His PCP is Dr. Charise Carwin, he states he has a wellness exam scheduled on September 24, 2018   History reviewed. No pertinent past medical history.  Patient Active Problem List   Diagnosis Date Noted  . Wellness examination  11/05/2016  . Varicocele 06/10/2014    Past Surgical History:  Procedure Laterality Date  . APPENDECTOMY     as a child  . HERNIA REPAIR     as a child  . INGUINAL HERNIA REPAIR  2011       Home Medications    Prior to Admission medications   Medication Sig Start Date End Date Taking? Authorizing Provider  Multiple Vitamins-Minerals (MENS MULTI VITAMIN & MINERAL PO) Take 1 tablet by mouth daily.    [provider]    Family History Family History  Problem Relation Age of Onset  . Diabetes Other   . Cancer Father 61    Social History Social History   Tobacco Use  . Smoking status: Never Smoker  . Smokeless tobacco: Never Used  Substance Use Topics  . Alcohol use: Yes    Alcohol/week: 8.0 standard drinks    Types: 8 Standard drinks or equivalent per week  . Drug use: No     Allergies   Patient has no known allergies.   Review of Systems Review of Systems Pertinent items noted in HPI and remainder of comprehensive ROS otherwise negative.   Physical Exam Triage Vital Signs ED Triage Vitals  Enc Vitals Group     BP 09/11/18 0916 127/83     Pulse Rate 09/11/18 0916  72     Resp 09/11/18 0916 18     Temp 09/11/18 0916 98.6 F (37 C)     Temp Source 09/11/18 0916 Oral     SpO2 09/11/18 0916 97 %     Weight 09/11/18 0918 232 lb (105.2 kg)     Height 09/11/18 0918 6' (1.829 m)     Head Circumference --      Peak Flow --      Pain Score 09/11/18 0918 0     Pain Loc --      Pain Edu? --      Excl. in [place]? --    No data found.  Updated Vital Signs BP 127/83 (BP Location: Right Arm)   Pulse 72   Temp 98.6 F (37 C) (Oral)   Resp 18   Ht 6' (1.829 m)   Wt 105.2 kg   SpO2 97%   BMI 31.46 kg/m    Physical Exam Vitals signs and nursing note reviewed.  Constitutional:      General: He is not in acute distress.    Appearance: He is well-developed.     Comments: Alert, well-developed well-nourished male, cooperative, no acute  cardiorespiratory distress.  He looks worried.  Here with adult daughter at his request.  HENT:     Head: Normocephalic and atraumatic.     Comments: TMs normal.  Oropharynx clear.  No tonsillar enlargement or exudate.  Nose within normal limits Eyes:     General: No scleral icterus.    Pupils: Pupils are equal, round, and reactive to light.  Neck:     Musculoskeletal: Normal range of motion and neck supple.     Comments: Supple, no adenopathy Cardiovascular:     Rate and Rhythm: Normal rate and regular rhythm.     Heart sounds: Normal heart sounds, S1 normal and S2 normal. No murmur. No friction rub. No gallop.      Comments: No murmurs gallops or rubs. Pulmonary:     Effort: Pulmonary effort is normal.     Breath sounds: Normal breath sounds and air entry. No stridor, decreased air movement or transmitted upper airway sounds. No decreased breath sounds, wheezing or rhonchi.  Abdominal:     General: There is no distension.  Musculoskeletal:     Right lower leg: No edema.     Left lower leg: No edema.  Skin:    General: Skin is warm and dry.     Comments: No skin lesions.  No cyanosis clubbing or edema.  Neurological:     Mental Status: He is alert and oriented to person, place, and time.     Cranial Nerves: Cranial nerves are intact. No cranial nerve deficit.     Sensory: No sensory deficit.     Motor: No weakness.     Coordination: Coordination normal.     Gait: Gait is intact.  Psychiatric:        Behavior: Behavior normal.   Mildly anxious.  No hallucinations.  Speech normal.   UC Treatments / Results  9:50 AM.  Discussed at length with patient and daughter.  Exam normal and normal pulse ox 97% while feeling short of breath, makes the case that this is not a pulmonary problem.  We discussed testing, and patient and daughter request chest x-ray and soft tissue x-ray of neck and will order these now. Also, offered EKG, but I feel that it is extremely unlikely this is a  cardiac problem given his negative work-up  and evaluation by cardiologist in the past.  They declined EKG today.   Labs (all labs ordered are listed, but only abnormal results are displayed) Labs Reviewed - No data to display  EKG None  Radiology Dg Neck Soft Tissue  Result Date: 09/11/2018 CLINICAL DATA:  Ten weeks of cough with sharp stabbing pain in the throat. EXAM: NECK SOFT TISSUES - 1+ VIEW COMPARISON:  None. FINDINGS: There is mild reversal of the normal cervical lordosis. There is moderate degenerative disc disease at C5-6 with milder disc disease at C6-7. The prevertebral soft tissue spaces are normal. The epiglottis is sharp. The trachea is midline. IMPRESSION: No acute soft tissue abnormality of the neck is observed. There is degenerative disc disease at C5-6 and C6-7 with reversal of the normal cervical lordosis. Electronically Signed   By: David  Martinique M.D.   On: 09/11/2018 10:14   Dg Chest 2 View  Result Date: 09/11/2018 CLINICAL DATA:  Shortness of breath for the past 10 weeks associated with cough triggered by deep inspiration, sharp stabbing throat pain. Never smoked. EXAM: CHEST - 2 VIEW COMPARISON:  PA and lateral chest x-ray of March 18, 2017 FINDINGS: The lungs are well-expanded and clear. The heart and pulmonary vascularity are normal. The mediastinum is normal in width. The trachea is midline. The bony thorax exhibits no acute abnormality. There is old deformity of the posterior aspects of the left fourth and fifth ribs. IMPRESSION: There is no active cardiopulmonary disease. Electronically Signed   By: David  Martinique M.D.   On: 09/11/2018 10:12    Procedures Procedures (including critical care time)  Medications Ordered in UC Medications - No data to display  Initial Impression / Assessment and Plan / UC Course  I have reviewed the triage vital signs and the nursing notes.  Pertinent labs & imaging results that were available during my care of the patient were  reviewed by me and considered in my medical decision making (see chart for details).  Clinical Course as of Sep 11 1138  Mon Sep 11, 2018  1024 Lateral soft tissue neck x-ray:The prevertebral soft tissue spaces are normal. The epiglottis is sharp. The trachea is midline.  IMPRESSION: No acute soft tissue abnormality of the neck is observed.    [DM]  1024 Chest x-ray: Within normal limits, no active disease.   [DM]    Clinical Course User Index [DM] Jacqulyn Cane, MD    Final Clinical Impressions(s) / UC Diagnoses Likely has panic attacks and globus.  Discussed at length with patient and daughter. No evidence of any cardiorespiratory cause for his symptoms.  Again, he declined repeating EKG.  Reviewed above negative chest x-ray and lateral soft tissue neck x-ray. After risk benefits alternatives discussed, they declined small prescription for short-term Xanax. They understand that PCP would need to manage patient ongoing for his symptoms, possibly with SSRI or trycyclic or other modality.    Final diagnoses:  Shortness of breath  Globus sensation  Panic attacks     Discharge Instructions     Chest x-ray and neck x-ray within normal limits. No evidence for heart or lung or neck abnormality as cause for your symptoms. Please read attached instruction sheets on globus and panic attacks. For now, I am not treating with any medication, as that needs to be discussed and treated by Dr. Charise Carwin your PCP.  Keep appointment for your physical on January 10.-Try to get a sooner appointment just to address the above symptoms.    They  declined short-term, low-dose antianxiety rx such as Xanax. Over 30 minutes spent, greater than 50% of the time spent for counseling and coordination of care. Reassured them that there is no evidence of any acute cardiorespiratory cause or acute obstructive neck cause for his symptoms. AVS printed.  We reviewed AVS, see AVS for details. We reviewed  printed instructions on panic attacks and globus.  Also verbal instructions.  Precautions discussed. Red flags discussed. Questions invited and answered. Patient voiced understanding and agreement.    ED Prescriptions    None        Jacqulyn Cane, MD 09/11/18 1148    Jacqulyn Cane, MD 09/11/18 [phone]

## 2018-09-20 ENCOUNTER — Telehealth: Payer: Self-pay

## 2018-09-20 DIAGNOSIS — R7309 Other abnormal glucose: Secondary | ICD-10-CM

## 2018-09-20 DIAGNOSIS — Z0000 Encounter for general adult medical examination without abnormal findings: Secondary | ICD-10-CM

## 2018-09-20 DIAGNOSIS — E7800 Pure hypercholesterolemia, unspecified: Secondary | ICD-10-CM

## 2018-09-20 NOTE — Telephone Encounter (Signed)
Ordered fasting labs.

## 2018-09-22 ENCOUNTER — Encounter: Payer: Self-pay | Admitting: Family Medicine

## 2018-09-22 ENCOUNTER — Ambulatory Visit (INDEPENDENT_AMBULATORY_CARE_PROVIDER_SITE_OTHER): Payer: BLUE CROSS/BLUE SHIELD | Admitting: Family Medicine

## 2018-09-22 VITALS — BP 118/62 | HR 67 | Ht 72.0 in | Wt 233.0 lb

## 2018-09-22 DIAGNOSIS — F5101 Primary insomnia: Secondary | ICD-10-CM

## 2018-09-22 DIAGNOSIS — Z23 Encounter for immunization: Secondary | ICD-10-CM

## 2018-09-22 DIAGNOSIS — R0683 Snoring: Secondary | ICD-10-CM

## 2018-09-22 DIAGNOSIS — Z0000 Encounter for general adult medical examination without abnormal findings: Secondary | ICD-10-CM

## 2018-09-22 NOTE — Patient Instructions (Signed)

## 2018-09-22 NOTE — Progress Notes (Signed)
CPE - Established Patient Office Visit  Subjective:  Patient ID: Johnny Ramirez, male    DOB: 10/22/1970  Age: 48 y.o. MRN: [id number]  CC:  Chief Complaint  Patient presents with  . Annual Exam    HPI  Johnny Ramirez presents for complete physical exam today.  He is not currently exercising but says he plans to start an exercise routine.  He had actually recently been to urgent care about 10 days ago for feeling a little dizzy when lying down and feeling a little short of breath.  He did not pass out though it was not having any actual vertigo.  He is concerned because he just feels exhausted.  He is not been sleeping well for years now.  He is only been getting about 4 hours per night and is just getting to the point where he is completely exhausted.  His daughter says that she has seen him snore almost her entire life.  And more recently spent the night at his house after he was not feeling well and says that he was even choking and gasping while trying to sleep.  He says he might have a couple coffee in the morning and occasionally might have a soda in the afternoon with caffeine it.  He does not watch TV to fall asleep.  He does feel like his bedroom is dark cool and quiet and conducive to sleep.  No past medical history on file.  Past Surgical History:  Procedure Laterality Date  . APPENDECTOMY     as a child  . HERNIA REPAIR     as a child  . INGUINAL HERNIA REPAIR  2011    Family History  Problem Relation Age of Onset  . Diabetes Other   . Cancer Father 65    Social History   Socioeconomic History  . Marital status: Single    Spouse name: Not on file  . Number of children: Not on file  . Years of education: Not on file  . Highest education level: Not on file  Occupational History  . Not on file  Social Needs  . Financial resource strain: Not on file  . Food insecurity:    Worry: Not on file    Inability: Not on file  . Transportation needs:    Medical:  Not on file    Non-medical: Not on file  Tobacco Use  . Smoking status: Never Smoker  . Smokeless tobacco: Never Used  Substance and Sexual Activity  . Alcohol use: Yes    Alcohol/week: 8.0 standard drinks    Types: 8 Standard drinks or equivalent per week  . Drug use: No  . Sexual activity: Not on file  Lifestyle  . Physical activity:    Days per week: Not on file    Minutes per session: Not on file  . Stress: Not on file  Relationships  . Social connections:    Talks on phone: Not on file    Gets together: Not on file    Attends religious service: Not on file    Active member of club or organization: Not on file    Attends meetings of clubs or organizations: Not on file    Relationship status: Not on file  . Intimate partner violence:    Fear of current or ex partner: Not on file    Emotionally abused: Not on file    Physically abused: Not on file    Forced sexual activity: Not on file  Other Topics Concern  . Not on file  Social History Narrative   No regular exercise.         Outpatient Medications Prior to Visit  Medication Sig Dispense Refill  . Multiple Vitamins-Minerals (MENS MULTI VITAMIN & MINERAL PO) Take 1 tablet by mouth daily.     No facility-administered medications prior to visit.     No Known Allergies  ROS Review of Systems    Objective:    Physical Exam  Constitutional: He is oriented to person, place, and time. He appears well-developed and well-nourished.  HENT:  Head: Normocephalic and atraumatic.  Right Ear: External ear normal.  Left Ear: External ear normal.  Nose: Nose normal.  Mouth/Throat: Oropharynx is clear and moist.  Eyes: Pupils are equal, round, and reactive to light. Conjunctivae and EOM are normal.  Neck: Normal range of motion. Neck supple. No thyromegaly present.  Cardiovascular: Normal rate, regular rhythm, normal heart sounds and intact distal pulses.  Pulmonary/Chest: Effort normal and breath sounds normal.   Abdominal: Soft. Bowel sounds are normal. He exhibits no distension and no mass. There is no abdominal tenderness. There is no rebound and no guarding.  Musculoskeletal: Normal range of motion.  Lymphadenopathy:    He has no cervical adenopathy.  Neurological: He is alert and oriented to person, place, and time. He has normal reflexes.  Skin: Skin is warm and dry.  Psychiatric: He has a normal mood and affect. His behavior is normal. Judgment and thought content normal.    BP 118/62   Pulse 67   Ht 6' (1.829 m)   Wt 233 lb (105.7 kg)   SpO2 100%   BMI 31.60 kg/m  Wt Readings from Last 3 Encounters:  09/22/18 233 lb (105.7 kg)  09/11/18 232 lb (105.2 kg)  03/21/17 224 lb (101.6 kg)     Health Maintenance Due  Topic Date Due  . HIV Screening  09/22/1986    There are no preventive care reminders to display for this patient.  Lab Results  Component Value Date   TSH 1.84 03/18/2017   Lab Results  Component Value Date   WBC 6.9 11/04/2016   HGB 14.8 11/04/2016   HCT 43.4 11/04/2016   MCV 91.0 11/04/2016   PLT 263 11/04/2016   Lab Results  Component Value Date   NA 139 11/04/2016   K 4.1 11/04/2016   CO2 26 11/04/2016   GLUCOSE 104 (H) 11/04/2016   BUN 19 11/04/2016   CREATININE 1.30 11/04/2016   BILITOT 0.6 11/04/2016   ALKPHOS 57 11/04/2016   AST 18 11/04/2016   ALT 21 11/04/2016   PROT 7.8 11/04/2016   ALBUMIN 4.5 11/04/2016   CALCIUM 9.4 11/04/2016   Lab Results  Component Value Date   CHOL 182 11/04/2016   Lab Results  Component Value Date   HDL 48 11/04/2016   Lab Results  Component Value Date   LDLCALC 101 09/19/2015   Lab Results  Component Value Date   TRIG 86 11/04/2016   Lab Results  Component Value Date   CHOLHDL 3.8 11/04/2016   No results found for: HGBA1C    Assessment & Plan:   Problem List Items Addressed This Visit      Other   Wellness examination - Primary    Other Visit Diagnoses    Need for immunization against  influenza       Relevant Orders   Flu Vaccine QUAD 36+ mos IM (Completed)   Snoring  Relevant Orders   Split night study   Primary insomnia       Relevant Orders   Split night study     Snoring/insomnia-discussed getting a sleep study.  Will order a split sleep study for [facility] long.  If insurance does not cover then will try home study.  I discussed the importance of ruling out sleep apnea before we tried any kind of sleep aid for insomnia.  He does not feel particularly stressed or overwhelmed.  Stop bang score of 5 discussed options of referring for sleep study at home versus lab.  We will try to order lab study and if not covered by insurance and will switch to home study.  Labs performed will call with results once available.  Did encourage him to start exercising regularly and continue to work on healthy diet.  No orders of the defined types were placed in this encounter.   Follow-up: No follow-ups on file.    Beatrice Lecher, MD

## 2018-09-23 LAB — COMPLETE METABOLIC PANEL WITH GFR
AG RATIO: 1.4 (calc) (ref 1.0–2.5)
ALT: 20 U/L (ref 9–46)
AST: 17 U/L (ref 10–40)
Albumin: 4.5 g/dL (ref 3.6–5.1)
Alkaline phosphatase (APISO): 62 U/L (ref 40–115)
BILIRUBIN TOTAL: 0.8 mg/dL (ref 0.2–1.2)
BUN: 12 mg/dL (ref 7–25)
CO2: 27 mmol/L (ref 20–32)
Calcium: 9.4 mg/dL (ref 8.6–10.3)
Chloride: 106 mmol/L (ref 98–110)
Creat: 0.99 mg/dL (ref 0.60–1.35)
GFR, EST AFRICAN AMERICAN: 105 mL/min/{1.73_m2} (ref >=60)
GFR, Est Non African American: 90 mL/min/{1.73_m2} (ref >=60)
Globulin: 3.2 g/dL (calc) (ref 1.9–3.7)
Glucose, Bld: 90 mg/dL (ref 65–99)
Potassium: 4 mmol/L (ref 3.5–5.3)
Sodium: 141 mmol/L (ref 135–146)
TOTAL PROTEIN: 7.7 g/dL (ref 6.1–8.1)

## 2018-09-23 LAB — CBC WITH DIFFERENTIAL/PLATELET
Absolute Monocytes: 564 cells/uL (ref 200–950)
BASOS ABS: 48 {cells}/uL (ref 0–200)
Basophils Relative: 0.7 %
Eosinophils Absolute: 68 cells/uL (ref 15–500)
Eosinophils Relative: 1 %
HCT: 43.9 % (ref 38.5–50.0)
HEMOGLOBIN: 15 g/dL (ref 13.2–17.1)
Lymphs Abs: 2128 cells/uL (ref 850–3900)
MCH: 30.9 pg (ref 27.0–33.0)
MCHC: 34.2 g/dL (ref 32.0–36.0)
MCV: 90.5 fL (ref 80.0–100.0)
MPV: 11 fL (ref 7.5–12.5)
Monocytes Relative: 8.3 %
NEUTROS ABS: 3992 {cells}/uL (ref 1500–7800)
Neutrophils Relative %: 58.7 %
Platelets: 270 10*3/uL (ref 140–400)
RBC: 4.85 10*6/uL (ref 4.20–5.80)
RDW: 12.3 % (ref 11.0–15.0)
Total Lymphocyte: 31.3 %
WBC: 6.8 10*3/uL (ref 3.8–10.8)

## 2018-09-23 LAB — LIPID PANEL W/REFLEX DIRECT LDL
CHOL/HDL RATIO: 3.4 (calc) (ref <5.0)
Cholesterol: 160 mg/dL (ref <200)
HDL: 47 mg/dL (ref >40)
LDL CHOLESTEROL (CALC): 99 mg/dL
NON-HDL CHOLESTEROL (CALC): 113 mg/dL (ref <130)
TRIGLYCERIDES: 58 mg/dL (ref <150)

## 2018-09-23 LAB — HEMOGLOBIN A1C
EAG (MMOL/L): 6.3 (calc)
Hgb A1c MFr Bld: 5.6 % of total Hgb (ref <5.7)
Mean Plasma Glucose: 114 (calc)

## 2018-09-25 NOTE — Telephone Encounter (Signed)
All labs are normal. 

## 2018-11-10 ENCOUNTER — Ambulatory Visit (HOSPITAL_BASED_OUTPATIENT_CLINIC_OR_DEPARTMENT_OTHER): Payer: BLUE CROSS/BLUE SHIELD | Attending: Family Medicine | Admitting: Internal Medicine

## 2018-11-10 DIAGNOSIS — F5101 Primary insomnia: Secondary | ICD-10-CM | POA: Diagnosis present

## 2018-11-10 DIAGNOSIS — R0683 Snoring: Secondary | ICD-10-CM | POA: Insufficient documentation

## 2018-11-18 DIAGNOSIS — R0683 Snoring: Secondary | ICD-10-CM

## 2018-11-18 NOTE — Procedures (Signed)
Patient Name: Johnny Ramirez, Johnny Ramirez Date: 11/10/2018 Gender: Male D.O.B: [birth year] Age (years): 48 Referring Provider: Beatrice Lecher Height (inches): 57 Interpreting Physician: Baird Lyons MD, ABSM Weight (lbs): 225 RPSGT: Earney [place] BMI: 31 MRN: [id number] Neck Size: 17.00  CLINICAL INFORMATION Sleep Study Type: Split Night CPAP Indication for sleep study: Snoring  Epworth Sleepiness Score: 8  SLEEP STUDY TECHNIQUE As per the AASM Manual for the Scoring of Sleep and Associated Events v2.3 (April 2016) with a hypopnea requiring 4% desaturations.  The channels recorded and monitored were frontal, central and occipital EEG, electrooculogram (EOG), submentalis EMG (chin), nasal and oral airflow, thoracic and abdominal wall motion, anterior tibialis EMG, snore microphone, electrocardiogram, and pulse oximetry. Continuous positive airway pressure (CPAP) was initiated when the patient met split night criteria and was titrated according to treat sleep-disordered breathing.  MEDICATIONS Medications self-administered by patient taken the night of the study : none reported  RESPIRATORY PARAMETERS Diagnostic  Total AHI (/hr): 38.0 RDI (/hr): 45.6 OA Index (/hr): 0.8 CA Index (/hr): 0.0 REM AHI (/hr): 65.0 NREM AHI (/hr): 35.6 Supine AHI (/hr): 38.0 Non-supine AHI (/hr): 0 Min O2 Sat (%): 81.0 Mean O2 (%): 92.6 Time below 88% (min): 4.3   Titration  Optimal Pressure (cm): 9 AHI at Optimal Pressure (/hr): 5.4 Min O2 at Optimal Pressure (%): 89.0 Supine % at Optimal (%): 100 Sleep % at Optimal (%): 51   SLEEP ARCHITECTURE The recording time for the entire night was 455.9 minutes.  During a baseline period of 177.0 minutes, the patient slept for 143.5 minutes in REM and nonREM, yielding a sleep efficiency of 81.1%%. Sleep onset after lights out was 14.8 minutes with a REM latency of 111.5 minutes. The patient spent 4.2%% of the night in stage N1 sleep, 87.5%% in stage  N2 sleep, 0.0%% in stage N3 and 8.4% in REM.  During the titration period of 272.9 minutes, the patient slept for 163.5 minutes in REM and nonREM, yielding a sleep efficiency of 59.9%%. Sleep onset after CPAP initiation was 27.8 minutes with a REM latency of 50.5 minutes. The patient spent 5.2%% of the night in stage N1 sleep, 55.7%% in stage N2 sleep, 0.0%% in stage N3 and 39.1% in REM.  CARDIAC DATA The 2 lead EKG demonstrated sinus rhythm. The mean heart rate was 100.0 beats per minute. Other EKG findings include: None.  LEG MOVEMENT DATA The total Periodic Limb Movements of Sleep (PLMS) were 0. The PLMS index was 0.0 .  IMPRESSIONS - Severe obstructive sleep apnea occurred during the diagnostic portion of the study (AHI = 38.0/hour). An optimal PAP pressure was selected for this patient ( 9 cm of water) - No significant central sleep apnea occurred during the diagnostic portion of the study (CAI = 0.0/hour). - Mild oxygen desaturation was noted during the diagnostic portion of the study (Min O2 = 81.0%). - The patient snored with moderate snoring volume during the diagnostic portion of the study. - No cardiac abnormalities were noted during this study. - Clinically significant periodic limb movements did not occur during sleep.  DIAGNOSIS - Obstructive Sleep Apnea (327.23 [G47.33 ICD-10])  RECOMMENDATIONS - Trial of CPAP therapy on 9 cm H2O or autopap 5-15. - Patient used a Medium size Fisher&Paykel Full Face Mask Simplus mask and heated humidification. - Be careful with alcohol, sedatives and other CNS depressants that may worsen sleep apnea and disrupt normal sleep architecture. - Sleep hygiene should be reviewed to assess factors that may improve sleep quality. -  Weight management and regular exercise should be initiated or continued.  [Electronically signed] 11/18/2018 12:16 PM  Baird Lyons MD, [place], American Board of Sleep Medicine   NPI: [id number]                          [place] [place] [facility]  ELECTRONICALLY SIGNED ON:  11/18/2018, 12:14 PM [place] PH: [phone]   FX: [phone] [place]

## 2018-11-27 ENCOUNTER — Other Ambulatory Visit: Payer: Self-pay

## 2018-11-27 ENCOUNTER — Telehealth: Payer: Self-pay | Admitting: *Deleted

## 2018-11-27 ENCOUNTER — Ambulatory Visit (INDEPENDENT_AMBULATORY_CARE_PROVIDER_SITE_OTHER): Payer: BLUE CROSS/BLUE SHIELD | Admitting: Family Medicine

## 2018-11-27 ENCOUNTER — Encounter: Payer: Self-pay | Admitting: Family Medicine

## 2018-11-27 VITALS — BP 129/69 | HR 81 | Ht 72.0 in | Wt 238.0 lb

## 2018-11-27 DIAGNOSIS — G4733 Obstructive sleep apnea (adult) (pediatric): Secondary | ICD-10-CM

## 2018-11-27 DIAGNOSIS — R0683 Snoring: Secondary | ICD-10-CM | POA: Diagnosis not present

## 2018-11-27 HISTORY — DX: Obstructive sleep apnea (adult) (pediatric): G47.33

## 2018-11-27 MED ORDER — AMBULATORY NON FORMULARY MEDICATION: 0 refills | Status: AC

## 2018-11-27 NOTE — Progress Notes (Signed)
Established Patient Office Visit  Subjective:  Patient ID: Johnny Ramirez, male    DOB: [birth year]  Age: 48 y.o. MRN: [id number]  CC:  Chief Complaint  Patient presents with  . Sleep Apnea    discuss results    HPI Johnny Ramirez presents for follow-up on recent split-night sleep study.  The study was performed on February 28.  He had initially complained about issues with snoring and insomnia. He is extremely fatigued and sleepy during the day.    No past medical history on file.  Past Surgical History:  Procedure Laterality Date  . APPENDECTOMY     as a child  . HERNIA REPAIR     as a child  . INGUINAL HERNIA REPAIR  2011    Family History  Problem Relation Age of Onset  . Diabetes Other   . Cancer Father 57    Social History   Socioeconomic History  . Marital status: Single    Spouse name: Not on file  . Number of children: Not on file  . Years of education: Not on file  . Highest education level: Not on file  Occupational History  . Not on file  Social Needs  . Financial resource strain: Not on file  . Food insecurity:    Worry: Not on file    Inability: Not on file  . Transportation needs:    Medical: Not on file    Non-medical: Not on file  Tobacco Use  . Smoking status: Never Smoker  . Smokeless tobacco: Never Used  Substance and Sexual Activity  . Alcohol use: Yes    Alcohol/week: 8.0 standard drinks    Types: 8 Standard drinks or equivalent per week  . Drug use: No  . Sexual activity: Not on file  Lifestyle  . Physical activity:    Days per week: Not on file    Minutes per session: Not on file  . Stress: Not on file  Relationships  . Social connections:    Talks on phone: Not on file    Gets together: Not on file    Attends religious service: Not on file    Active member of club or organization: Not on file    Attends meetings of clubs or organizations: Not on file    Relationship status: Not on file  . Intimate partner violence:     Fear of current or ex partner: Not on file    Emotionally abused: Not on file    Physically abused: Not on file    Forced sexual activity: Not on file  Other Topics Concern  . Not on file  Social History Narrative   No regular exercise.         Outpatient Medications Prior to Visit  Medication Sig Dispense Refill  . Multiple Vitamins-Minerals (MENS MULTI VITAMIN & MINERAL PO) Take 1 tablet by mouth daily.     No facility-administered medications prior to visit.     No Known Allergies  ROS Review of Systems    Objective:    Physical Exam  BP 129/69   Pulse 81   Ht 6' (1.829 m)   Wt 238 lb (108 kg)   SpO2 100%   BMI 32.28 kg/m  Wt Readings from Last 3 Encounters:  11/27/18 238 lb (108 kg)  11/11/18 225 lb (102.1 kg)  09/22/18 233 lb (105.7 kg)     Health Maintenance Due  Topic Date Due  . HIV Screening  09/22/1986  There are no preventive care reminders to display for this patient.  Lab Results  Component Value Date   TSH 1.84 03/18/2017   Lab Results  Component Value Date   WBC 6.8 09/22/2018   HGB 15.0 09/22/2018   HCT 43.9 09/22/2018   MCV 90.5 09/22/2018   PLT 270 09/22/2018   Lab Results  Component Value Date   NA 141 09/22/2018   K 4.0 09/22/2018   CO2 27 09/22/2018   GLUCOSE 90 09/22/2018   BUN 12 09/22/2018   CREATININE 0.99 09/22/2018   BILITOT 0.8 09/22/2018   ALKPHOS 57 11/04/2016   AST 17 09/22/2018   ALT 20 09/22/2018   PROT 7.7 09/22/2018   ALBUMIN 4.5 11/04/2016   CALCIUM 9.4 09/22/2018   Lab Results  Component Value Date   CHOL 160 09/22/2018   Lab Results  Component Value Date   HDL 47 09/22/2018   Lab Results  Component Value Date   LDLCALC 99 09/22/2018   Lab Results  Component Value Date   TRIG 58 09/22/2018   Lab Results  Component Value Date   CHOLHDL 3.4 09/22/2018   Lab Results  Component Value Date   HGBA1C 5.6 09/22/2018      Assessment & Plan:   Problem List Items Addressed This Visit       Respiratory   OSA (obstructive sleep apnea)   Relevant Medications   AMBULATORY NON FORMULARY MEDICATION    Other Visit Diagnoses    Snoring    -  Primary     Obstructive sleep apnea-new diagnosis.  Reviewed the results with him indicating an apnea hypotony index of 38 which is significant for severe sleep apnea.  We discussed treatment options including CPAP.  He did have a split-night sleep study and I set him to 9 cm water pressure.  He says that the mask that they used it was too tight over his nasal bridge she actually does have a knot over the nasal bridge.  This may make it difficult to find a mass that is going to work really well for him but we will certainly try.  I will see him back in 1 month.  Meds ordered this encounter  Medications  . AMBULATORY NON FORMULARY MEDICATION    Sig: Medication Name: CPAP set to 9 cm water pressure with humidifier and supplies. Please fit for mask.  Dx OSA.    Dispense:  1 vial    Refill:  0    Follow-up: Return in about 4 weeks (around 12/25/2018), or if symptoms worsen or fail to improve, for sleep apnea .    Beatrice Lecher, MD

## 2018-11-27 NOTE — Telephone Encounter (Signed)
Order for CPAP faxed, confirmation received.Johnny Ramirez, [name]

## 2019-01-05 ENCOUNTER — Telehealth: Payer: Self-pay | Admitting: Family Medicine

## 2019-01-05 ENCOUNTER — Encounter: Payer: Self-pay | Admitting: Family Medicine

## 2019-01-05 ENCOUNTER — Ambulatory Visit (INDEPENDENT_AMBULATORY_CARE_PROVIDER_SITE_OTHER): Payer: BLUE CROSS/BLUE SHIELD | Admitting: Family Medicine

## 2019-01-05 VITALS — Ht 72.0 in | Wt 238.0 lb

## 2019-01-05 DIAGNOSIS — G4733 Obstructive sleep apnea (adult) (pediatric): Secondary | ICD-10-CM

## 2019-01-05 NOTE — Telephone Encounter (Signed)
Is call DME supplier to see if we could get a download from his CPAP machine.  I cannot quite figure out which company we sent the request to.

## 2019-01-05 NOTE — Telephone Encounter (Signed)
Pt gets his supplies via [place].Elouise Munroe, [place]

## 2019-01-05 NOTE — Progress Notes (Signed)
Virtual Visit via Video Note  I connected with Johnny Ramirez on 01/05/19 at  1:00 PM EDT by a video enabled telemedicine application and verified that I am speaking with the correct person using two identifiers.   I discussed the limitations of evaluation and management by telemedicine and the availability of in person appointments. The patient expressed understanding and agreed to proceed.  Subjective:    CC: F/U new start CPAP for sleep apnea.   HPI:  4-week follow-up for new start of CPAP for diagnosed sleep apnea.  He was set to 9 cm of water pressure he is following up today to just see how he is doing with the device and if he is noticing any improvement in his symptoms. about 75% of the time works great. Thinks he needs a new mask. bc having seal problems.  Had 3 nights where fell asleep before put it on. Sleeps great on it.  MostAHI per night is 1/2 per night. He feels much more alert during thdaytime. Waking up refreshed.     Past medical history, Surgical history, Family history not pertinant except as noted below, Social history, Allergies, and medications have been entered into the medical record, reviewed, and corrections made.   Review of Systems: No fevers, chills, night sweats, weight loss, chest pain, or shortness of breath.   Objective:    General: Speaking clearly in complete sentences without any shortness of breath.  Alert and oriented x3.  Normal judgment. No apparent acute distress.    Impression and Recommendations:    Obstructive sleep apnea on CPAP Will get a download from the company.  He is feeling well rested and much more alert during the daytime so he is getting significant benefit from it.  I did encourage him to call the company and see if he can get maybe a different type of mask than what he is had.  The initial one was pressing too much on the nasal bridge this 1 is mostly over the mouth and he seems to really like it is just he is getting that  occasional leak maybe 1 out of 4 nights.  He could call them and see if there is at least one other option that he could try but he says even if he had to stick with what he has he would do so because of the benefit that he is seeing.  F/U in 6-12 months.     I discussed the assessment and treatment plan with the patient. The patient was provided an opportunity to ask questions and all were answered. The patient agreed with the plan and demonstrated an understanding of the instructions.   The patient was advised to call back or seek an in-person evaluation if the symptoms worsen or if the condition fails to improve as anticipated.   Beatrice Lecher, MD

## 2019-01-05 NOTE — Telephone Encounter (Signed)
Spoke with Pt, he is not home to check the machine but believes it is from [facility]. Will contact for download.

## 2019-01-05 NOTE — Progress Notes (Signed)
Pt reports that he's doing well with the exception of when he turns over using the mask it breaks the seal and it sounds like a tornado. Otherwise he is doing well.Maryruth Eve, [place] Crocker, CMA

## 2019-01-05 NOTE — Telephone Encounter (Signed)
Called [facility] - they do not have a file for Pt.   Called Pt back, he will check his machine when he gets home and contact clinic to let us know what company it's from.

## 2019-01-08 NOTE — Telephone Encounter (Signed)
Okay thank you.  Let us call [employer] and try to get a download from the CPAP please.

## 2019-01-09 NOTE — Telephone Encounter (Addendum)
Spoke w/angela asked that they send over his most recent download for CPAP. She wanted to check with someone else about his report before she sent his information because it was showing that he had 0 (zero) compliance with the machine since he has had it. Transferred to Manuela Schwartz and she said that his machine was not transmitting data.  She was able to locate and will fax in about 5 mins. Maryruth Eve, [place] Crocker, CMA

## 2019-01-10 ENCOUNTER — Telehealth: Payer: Self-pay | Admitting: Family Medicine

## 2019-01-10 NOTE — Telephone Encounter (Signed)
Call pt: CPAP download showed good results. Appears to be working well overall.

## 2019-01-10 NOTE — Telephone Encounter (Signed)
Pt advised. Told him that should he have any issues to let us know.Maryruth Eve, [place] Crocker, CMA

## 2019-05-26 ENCOUNTER — Other Ambulatory Visit: Payer: Self-pay

## 2019-05-26 ENCOUNTER — Emergency Department
Admission: EM | Admit: 2019-05-26 | Discharge: 2019-05-26 | Disposition: A | Discharge status: Home / Self Care | Payer: BC Managed Care – PPO | Source: Home / Self Care | Attending: Emergency Medicine | Admitting: Emergency Medicine

## 2019-05-26 DIAGNOSIS — M79622 Pain in left upper arm: Secondary | ICD-10-CM

## 2019-05-26 NOTE — ED Provider Notes (Signed)
[name]    CSN: [id number] Arrival date & time: 05/26/19  1517 Saturday      History   Chief Complaint Chief Complaint  Patient presents with  . Arm Pain    LT armpit    HPI Johnny Ramirez is a 48 y.o. male.   HPI  48 year old male presents to urgent care with 2 weeks of vague discomfort left axilla.  The discomfort is vague, he feels it is just under the skin of the left axilla, "sometimes a pinching feeling, sometimes a sticky feeling" it is worse with some movements but he is not sure which movements.  Never has this with physical exertion.  Sometimes worse if he is standing still.  No radiation.  Denies any skin lesions in the axilla.  No itch.  Denies insect bite.  Denies any pimples or pustules in the left axilla. Denies fever or chills or nausea or vomiting.  No exertional chest pain or shortness of breath.  Denies neck pain or neck symptoms. Denies URI symptoms or cough or chest congestion or chest or back pain. No abdominal pain or nausea or vomiting. No focal neurologic symptoms.  Denies headache.   I reviewed past medical history in EMR:  Seen here in urgent care 09/11/2018 for vague symptoms, with normal physical exam, diagnosis was likely globus symptoms. Seen in urgent care 03/2017 with palpitations with negative evaluation and work-up including normal TSH and T4 and CBC and CMP in 2018. Was seen for an episode of dizziness 04/2016 which resolved. Saw Dr. Dorris Carnes, MD, cardiologist in 2018 for follow-up after the July 2018 episode of symptoms.  Dr. Harrington Challenger felt EKG was normal and no sign of arrhythmia and felt he did not need further cardiology work-up.  His PCP is Dr. Madilyn Fireman    History reviewed. No pertinent past medical history.  Patient Active Problem List   Diagnosis Date Noted  . OSA (obstructive sleep apnea) 11/27/2018  . Varicocele 06/10/2014    Past Surgical History:  Procedure Laterality Date  . APPENDECTOMY     as a child  .  HERNIA REPAIR     as a child  . INGUINAL HERNIA REPAIR  2011       Home Medications    Prior to Admission medications   Medication Sig Start Date End Date Taking? Authorizing Provider  AMBULATORY NON FORMULARY MEDICATION Medication Name: CPAP set to 9 cm water pressure with humidifier and supplies. Please fit for mask.  Dx OSA. 11/27/18   Hali Marry, MD  Multiple Vitamins-Minerals (MENS MULTI VITAMIN & MINERAL PO) Take 1 tablet by mouth daily.    [provider]    Family History Family History  Problem Relation Age of Onset  . Diabetes Other   . Cancer Father 4    Social History Social History   Tobacco Use  . Smoking status: Never Smoker  . Smokeless tobacco: Never Used  Substance Use Topics  . Alcohol use: Yes    Alcohol/week: 8.0 standard drinks    Types: 8 Standard drinks or equivalent per week  . Drug use: No     Allergies   Patient has no known allergies.   Review of Systems Review of Systems  All other systems reviewed and are negative.  Pertinent items noted in HPI and remainder of comprehensive ROS otherwise negative.   Physical Exam Triage Vital Signs ED Triage Vitals  Enc Vitals Group     BP 05/26/19 1558 124/80  Pulse Rate 05/26/19 1558 66     Resp 05/26/19 1558 18     Temp 05/26/19 1558 98.3 F (36.8 C)     Temp Source 05/26/19 1558 Oral     SpO2 05/26/19 1558 99 %     Weight 05/26/19 1559 238 lb (108 kg)     Height 05/26/19 1559 6' (1.829 m)     Head Circumference --      Peak Flow --      Pain Score 05/26/19 1559 0     Pain Loc --      Pain Edu? --      Excl. in [place]? --    No data found.  Updated Vital Signs BP 124/80 (BP Location: Right Arm)   Pulse 66   Temp 98.3 F (36.8 C) (Oral)   Resp 18   Ht 6' (1.829 m)   Wt 108 kg   SpO2 99%   BMI 32.28 kg/m   Visual Acuity Right Eye Distance:   Left Eye Distance:   Bilateral Distance:    Right Eye Near:   Left Eye Near:    Bilateral Near:      Physical Exam Vitals signs reviewed.  Constitutional:      General: He is not in acute distress.    Appearance: He is well-developed.  HENT:     Head: Normocephalic and atraumatic.  Eyes:     General: No scleral icterus.    Pupils: Pupils are equal, round, and reactive to light.  Neck:     Musculoskeletal: Normal range of motion and neck supple.  Nontender.  No adenopathy or masses. Cardiovascular:     Rate and Rhythm: Normal rate and regular rhythm.  No murmur Pulmonary:     Effort: Pulmonary effort is normal.  Lungs clear to auscultation. Abdominal:     General: There is no distension.  Nontender. Skin:    General: Skin is warm and dry.  No rash or any skin lesion Neurological:     Mental Status: He is alert and oriented to person, place, and time.     Cranial Nerves: No cranial nerve deficit.  Psychiatric:        Behavior: Behavior normal.  Left axilla: No skin lesion or deformity.  No specific tenderness.  No abnormality seen or felt.  No adenopathy or mass. Left shoulder exam normal.  Nontender.  Normal range of motion.  UC Treatments / Results  Labs (all labs ordered are listed, but only abnormal results are displayed) Labs Reviewed - No data to display  EKG   Radiology No results found.  Procedures Procedures (including critical care time)  Medications Ordered in UC Medications - No data to display  Initial Impression / Assessment and Plan / UC Course  I have reviewed the triage vital signs and the nursing notes.  Pertinent labs & imaging results that were available during my care of the patient were reviewed by me and considered in my medical decision making (see chart for details).    Explained to him that physical exam was within normal limits.  No sign of any skin cause for left axillary pain.  It is possible that he has a deep left axillary muscle inflammation, and certain positions can exacerbate this sharp discomfort.  The pain is not exertional and  there are no cardiorespiratory symptoms and he declines any other testing declines imaging or EKG and I agree that symptoms are not consistent with cardiorespiratory cause.  I advised heat and  ibuprofen.  If any red flags, return or go to emergency room if any severe symptoms. Otherwise follow-up with Dr. Madilyn Fireman, PCP, within 2 weeks if symptoms persist. He voiced understanding and agreement.  He declined AVS  Over 30 minutes spent, greater than 50% of the time spent for counseling and coordination of care.   Final Clinical Impressions(s) / UC Diagnoses   Final diagnoses:  Axillary pain, left   Discharge Instructions   None    ED Prescriptions    None        Jacqulyn Cane, MD 05/26/19 1839

## 2019-05-26 NOTE — ED Triage Notes (Signed)
Pt c/o pain under his left arm x 2 weeks. Denies any other sxs.

## 2019-07-03 ENCOUNTER — Other Ambulatory Visit: Payer: Self-pay

## 2019-07-03 ENCOUNTER — Encounter: Payer: Self-pay | Admitting: Emergency Medicine

## 2019-07-03 ENCOUNTER — Emergency Department (INDEPENDENT_AMBULATORY_CARE_PROVIDER_SITE_OTHER): Payer: BC Managed Care – PPO

## 2019-07-03 ENCOUNTER — Emergency Department
Admission: EM | Admit: 2019-07-03 | Discharge: 2019-07-03 | Disposition: A | Discharge status: Home / Self Care | Payer: BC Managed Care – PPO | Source: Home / Self Care | Attending: Family Medicine | Admitting: Family Medicine

## 2019-07-03 DIAGNOSIS — M5412 Radiculopathy, cervical region: Secondary | ICD-10-CM

## 2019-07-03 DIAGNOSIS — M542 Cervicalgia: Secondary | ICD-10-CM

## 2019-07-03 DIAGNOSIS — M549 Dorsalgia, unspecified: Secondary | ICD-10-CM | POA: Diagnosis not present

## 2019-07-03 DIAGNOSIS — R202 Paresthesia of skin: Secondary | ICD-10-CM

## 2019-07-03 MED ORDER — PREDNISONE 20 MG PO TABS: ORAL_TABLET | ORAL | 0 refills | Status: DC

## 2019-07-03 NOTE — ED Triage Notes (Signed)
PT reports soreness under both arms. PT was seen here for same symptoms under left arm 05/26/19. PT reports left arm easily goes numb depending on what position he's in. Symptoms are starting under left arm as well.

## 2019-07-03 NOTE — Discharge Instructions (Addendum)
Put ice on the affected area. If you have a soft neck collar, remove it as told by your health care provider. Put ice in a plastic bag. Place a towel between your skin and the bag. Leave the ice on for 20 minutes, 2-3 times a day.

## 2019-07-03 NOTE — ED Provider Notes (Signed)
[name]    CSN: [id number] Arrival date & time: 07/03/19  1636      History   Chief Complaint Chief Complaint  Patient presents with   Shoulder Pain    HPI Johnny Ramirez is a 48 y.o. male.   Patient complains of onset of vague discomfort in his left axilla about 6+ weeks ago, having been evaluated here on 05/26/19.  Since then the discomfort has started to radiate into his upper left chest intermittently, and recently has developed in his right axillary area also.  During the past week or so he has developed intermittent recurring numbness in his entire left arm when his left shoulder is fully adducted (left arm by his side), relieved when he abducts his left shoulder.  His pain is not initiated by exertion.  He denies neck pain, URI symptoms, cough, chest congestion and shortness of breath.  He denies injury.  He does not smoke.   The history is provided by the patient.    History reviewed. No pertinent past medical history.  Patient Active Problem List   Diagnosis Date Noted   OSA (obstructive sleep apnea) 11/27/2018   Varicocele 06/10/2014    Past Surgical History:  Procedure Laterality Date   APPENDECTOMY     as a child   HERNIA REPAIR     as a child   INGUINAL HERNIA REPAIR  2011       Home Medications    Prior to Admission medications   Medication Sig Start Date End Date Taking? Authorizing Provider  Multiple Vitamins-Minerals (MENS MULTI VITAMIN & MINERAL PO) Take 1 tablet by mouth daily.   Yes [provider]  AMBULATORY NON FORMULARY MEDICATION Medication Name: CPAP set to 9 cm water pressure with humidifier and supplies. Please fit for mask.  Dx OSA. 11/27/18   Hali Marry, MD  predniSONE (DELTASONE) 20 MG tablet Take one tab by mouth twice daily for 4 days, then one daily. Take with food. 07/03/19   Kandra Nicolas, MD    Family History Family History  Problem Relation Age of Onset   Diabetes Other     Cancer Father 46    Social History Social History   Tobacco Use   Smoking status: Never Smoker   Smokeless tobacco: Never Used  Substance Use Topics   Alcohol use: Yes    Alcohol/week: 8.0 standard drinks    Types: 8 Standard drinks or equivalent per week   Drug use: No     Allergies   Patient has no known allergies.   Review of Systems Review of Systems  Constitutional: Negative for activity change, appetite change, chills, diaphoresis, fatigue, fever and unexpected weight change.  HENT: Negative.   Eyes: Negative.   Respiratory: Negative for cough, chest tightness, shortness of breath and wheezing.   Cardiovascular: Negative.   Gastrointestinal: Negative.   Genitourinary: Negative.   Musculoskeletal: Negative.   Skin: Negative.   Neurological: Positive for numbness. Negative for tremors, syncope, facial asymmetry, speech difficulty, weakness, light-headedness and headaches.     Physical Exam Triage Vital Signs ED Triage Vitals [07/03/19 1654]  Enc Vitals Group     BP 131/84     Pulse Rate 78     Resp 16     Temp 98.7 F (37.1 C)     Temp Source Oral     SpO2 98 %     Weight      Height      Head Circumference  Peak Flow      Pain Score 2     Pain Loc      Pain Edu?      Excl. in [place]?    No data found.  Updated Vital Signs BP 131/84    Pulse 78    Temp 98.7 F (37.1 C) (Oral)    Resp 16    SpO2 98%   Visual Acuity Right Eye Distance:   Left Eye Distance:   Bilateral Distance:    Right Eye Near:   Left Eye Near:    Bilateral Near:     Physical Exam Vitals signs and nursing note reviewed.  Constitutional:      General: He is not in acute distress. HENT:     Head: Normocephalic.     Right Ear: External ear normal.     Left Ear: External ear normal.     Nose: Nose normal.     Mouth/Throat:     Pharynx: Oropharynx is clear.  Eyes:     Conjunctiva/sclera: Conjunctivae normal.     Pupils: Pupils are equal, round, and reactive to  light.  Neck:     Musculoskeletal: Normal range of motion and neck supple. No muscular tenderness.     Comments: Patient's symptoms are not reproduced by neck motion. Cardiovascular:     Heart sounds: Normal heart sounds.  Pulmonary:     Breath sounds: Normal breath sounds.  Abdominal:     Palpations: Abdomen is soft.     Tenderness: There is no abdominal tenderness.  Musculoskeletal:        General: No swelling, tenderness or signs of injury.     Right lower leg: No edema.     Left lower leg: No edema.  Lymphadenopathy:     Cervical: No cervical adenopathy.  Skin:    General: Skin is warm and dry.          Comments: Patient has no tenderness to palpation at sites of his discomfort.  Neurological:     General: No focal deficit present.     Mental Status: He is alert.     Sensory: No sensory deficit.     Motor: No weakness.      UC Treatments / Results  Labs (all labs ordered are listed, but only abnormal results are displayed) Labs Reviewed - No data to display  EKG   Radiology Dg Cervical Spine Complete  Result Date: 07/03/2019 CLINICAL DATA:  Cervicalgia with left upper extremity paresthesias EXAM: CERVICAL SPINE - COMPLETE 4+ VIEW COMPARISON:  None. FINDINGS: Frontal, lateral, open-mouth odontoid, and bilateral oblique views were obtained. There is no fracture or spondylolisthesis. Prevertebral soft tissues and predental space regions are normal. There is moderately severe disc space narrowing at C5-6. There is milder disc space narrowing at C6-7. Other disc spaces appear unremarkable. There are anterior osteophytes at C5, C6, and C7. There is facet hypertrophy with exit foraminal narrowing at C5-6 and C6-7 bilaterally. There is reversal of lordotic curvature. Lung apices are clear. IMPRESSION: Osteoarthritic change, most severe at C5-6 but also moderate C6-7. No fracture or spondylolisthesis. Reversal lordotic curvature most likely is indicative of muscle spasm.  Electronically Signed   By: Lowella Grip III M.D.   On: 07/03/2019 18:13   Dg Thoracic Spine 2 View  Result Date: 07/03/2019 CLINICAL DATA:  Dorsalgia EXAM: THORACIC SPINE 3 VIEWS COMPARISON:  None. FINDINGS: Frontal, lateral, and swimmer's views were obtained. There is lower thoracic levoscoliosis. There is no fracture or spondylolisthesis.  There is slight disc space narrowing at several levels in the midthoracic region. No erosive change or paraspinous lesion. Visualized lungs clear. IMPRESSION: Lower thoracic levoscoliosis. Slight osteoarthritic change in the midthoracic region. No fracture or spondylolisthesis. Electronically Signed   By: Lowella Grip III M.D.   On: 07/03/2019 18:11    Procedures Procedures (including critical care time)  Medications Ordered in UC Medications - No data to display  Initial Impression / Assessment and Plan / UC Course  I have reviewed the triage vital signs and the nursing notes.  Pertinent labs & imaging results that were available during my care of the patient were reviewed by me and considered in my medical decision making (see chart for details).    Begin prednisone burst/taper. Followup with Dr. Aundria Mems or Dr. Lynne Leader ([facility]) in one week for further evaluation and management.   Final Clinical Impressions(s) / UC Diagnoses   Final diagnoses:  Cervical radiculopathy     Discharge Instructions      Put ice on the affected area. ? If you have a soft neck collar, remove it as told by your health care provider. ? Put ice in a plastic bag. ? Place a towel between your skin and the bag. ? Leave the ice on for 20 minutes, 2-3 times a day.    ED Prescriptions    Medication Sig Dispense Auth. Provider   predniSONE (DELTASONE) 20 MG tablet Take one tab by mouth twice daily for 4 days, then one daily. Take with food. 12 tablet Kandra Nicolas, MD        Kandra Nicolas, MD 07/05/19 [phone]

## 2019-07-27 ENCOUNTER — Encounter: Payer: Self-pay | Admitting: Family Medicine

## 2019-07-27 ENCOUNTER — Ambulatory Visit: Payer: BC Managed Care – PPO | Admitting: Family Medicine

## 2019-07-27 ENCOUNTER — Other Ambulatory Visit: Payer: Self-pay

## 2019-07-27 VITALS — BP 123/76 | HR 83 | Ht 72.0 in | Wt 237.0 lb

## 2019-07-27 DIAGNOSIS — M79629 Pain in unspecified upper arm: Secondary | ICD-10-CM

## 2019-07-27 DIAGNOSIS — M542 Cervicalgia: Secondary | ICD-10-CM

## 2019-07-27 NOTE — Progress Notes (Signed)
Acute Office Visit  Subjective:    Patient ID: Johnny Ramirez, male    DOB: Dec 19, 1970, 48 y.o.   MRN: [id number]  No chief complaint on file.   HPI Patient is in today for neck pain. He says he started to notice some left axillary pain that occurred while he was on vacation.  He denies any trauma or heavy lifting or anything that he thinks would have actually injured the area or pulled a muscle but after a few weeks of discomfort decided to go to urgent care.  They felt like it could be musculoskeletal.  So he continued to watch it.  And then he actually went back to urgent care on October 20.  At that point they did some x-rays and actually started him on prednisone.  His of the prednisone was actually help in the axillary pain but after about 3 days on the medication he started to notice that his neck was starting to hurt mostly upper anterior bilateral neck.  He was not having any difficulty swallowing.  He feels it is more like a dull ache.  Denies any swelling did not feel any swollen glands in his neck.  No fevers chills or sweats no postnasal drip.  No upper respiratory or lower respiratory symptoms.  The neck pain has continued and now in the last couple of days he started to feel a sharp pain with deep inhalation just at the supraclavicular notch.  He says again he is not having any difficulty swallowing [place] never had any thyroid problems.  Now he is also having right axillary pain and just does not know why again has not felt any swollen knots or nodules.  No injury or trauma.  No past medical history on file.  Past Surgical History:  Procedure Laterality Date  . APPENDECTOMY     as a child  . HERNIA REPAIR     as a child  . INGUINAL HERNIA REPAIR  2011    Family History  Problem Relation Age of Onset  . Diabetes Other   . Cancer Father 7    Social History   Socioeconomic History  . Marital status: Single    Spouse name: Not on file  . Number of children: Not on file   . Years of education: Not on file  . Highest education level: Not on file  Occupational History  . Not on file  Social Needs  . Financial resource strain: Not on file  . Food insecurity    Worry: Not on file    Inability: Not on file  . Transportation needs    Medical: Not on file    Non-medical: Not on file  Tobacco Use  . Smoking status: Never Smoker  . Smokeless tobacco: Never Used  Substance and Sexual Activity  . Alcohol use: Yes    Alcohol/week: 8.0 standard drinks    Types: 8 Standard drinks or equivalent per week  . Drug use: No  . Sexual activity: Not on file  Lifestyle  . Physical activity    Days per week: Not on file    Minutes per session: Not on file  . Stress: Not on file  Relationships  . Social Herbalist on phone: Not on file    Gets together: Not on file    Attends religious service: Not on file    Active member of club or organization: Not on file    Attends meetings of clubs or organizations: Not  on file    Relationship status: Not on file  . Intimate partner violence    Fear of current or ex partner: Not on file    Emotionally abused: Not on file    Physically abused: Not on file    Forced sexual activity: Not on file  Other Topics Concern  . Not on file  Social History Narrative   No regular exercise.         Outpatient Medications Prior to Visit  Medication Sig Dispense Refill  . AMBULATORY NON FORMULARY MEDICATION Medication Name: CPAP set to 9 cm water pressure with humidifier and supplies. Please fit for mask.  Dx OSA. 1 vial 0  . Multiple Vitamins-Minerals (MENS MULTI VITAMIN & MINERAL PO) Take 1 tablet by mouth daily.    . predniSONE (DELTASONE) 20 MG tablet Take one tab by mouth twice daily for 4 days, then one daily. Take with food. 12 tablet 0   No facility-administered medications prior to visit.     No Known Allergies  ROS     Objective:    Physical Exam  Constitutional: He is oriented to person, place, and  time. He appears well-developed and well-nourished.  HENT:  Head: Normocephalic and atraumatic.  Right Ear: External ear normal.  Left Ear: External ear normal.  Nose: Nose normal.  TMs and canals are clear.   Eyes: Pupils are equal, round, and reactive to light. Conjunctivae and EOM are normal.  Neck: Neck supple. No thyromegaly present.  Cardiovascular: Normal rate, regular rhythm and normal heart sounds.  Pulmonary/Chest: Effort normal and breath sounds normal.  Musculoskeletal:     Comments: No lymphadenopathy or tenderness in the left axilla.  Questionable small lymph node in the right axilla.  No significant lymphadenopathy in the cervical spine.  No palpable nodules on the thyroid gland no thyromegaly.  Lymphadenopathy:    He has no cervical adenopathy.  Neurological: He is alert and oriented to person, place, and time.  Skin: Skin is warm and dry.  Psychiatric: He has a normal mood and affect. His behavior is normal.    BP 123/76   Pulse 83   Ht 6' (1.829 m)   Wt 237 lb (107.5 kg)   SpO2 96%   BMI 32.14 kg/m  Wt Readings from Last 3 Encounters:  07/27/19 237 lb (107.5 kg)  05/26/19 238 lb (108 kg)  01/05/19 238 lb (108 kg)    There are no preventive care reminders to display for this patient.  There are no preventive care reminders to display for this patient.   Lab Results  Component Value Date   TSH 1.84 03/18/2017   Lab Results  Component Value Date   WBC 6.8 09/22/2018   HGB 15.0 09/22/2018   HCT 43.9 09/22/2018   MCV 90.5 09/22/2018   PLT 270 09/22/2018   Lab Results  Component Value Date   NA 141 09/22/2018   K 4.0 09/22/2018   CO2 27 09/22/2018   GLUCOSE 90 09/22/2018   BUN 12 09/22/2018   CREATININE 0.99 09/22/2018   BILITOT 0.8 09/22/2018   ALKPHOS 57 11/04/2016   AST 17 09/22/2018   ALT 20 09/22/2018   PROT 7.7 09/22/2018   ALBUMIN 4.5 11/04/2016   CALCIUM 9.4 09/22/2018   Lab Results  Component Value Date   CHOL 160 09/22/2018    Lab Results  Component Value Date   HDL 47 09/22/2018   Lab Results  Component Value Date   LDLCALC 99 09/22/2018  Lab Results  Component Value Date   TRIG 58 09/22/2018   Lab Results  Component Value Date   CHOLHDL 3.4 09/22/2018   Lab Results  Component Value Date   HGBA1C 5.6 09/22/2018       Assessment & Plan:   Problem List Items Addressed This Visit    None    Visit Diagnoses    Neck pain    -  Primary   Relevant Orders   CBC w/Diff   CMP with Estimated GFR (CPT-80053)   Sed Rate (ESR)   TSH   Pain in axilla, unspecified laterality       Relevant Orders   CBC w/Diff   CMP with Estimated GFR (CPT-80053)   Sed Rate (ESR)   TSH     Axillary pain-unclear etiology he did not have any trauma or injury to cause it.  I did feel possibly a little swollen lymph node in the right axilla but it was not 100% clear distinct.  So would like to get a CBC just to see if his blood count is off in any way to indicate any underlying problem such as lymphoma etc.  Okay to use Tylenol only  Neck pain-again unclear etiology I do not palpate any distinct lymphadenopathy or thyromegaly or nodules.  But again we will start with a CBC.  As well as a CMP will check for inflammation with a sed rate and also check for thyroid issues.  Will call with results once available.  If everything comes back normal and I cannot find any specific problems then consider referral to ENT for further work-up and maybe ultrasound of the axillary regions.  No orders of the defined types were placed in this encounter.    Beatrice Lecher, MD

## 2019-07-28 LAB — CBC WITH DIFFERENTIAL/PLATELET
Absolute Monocytes: 491 cells/uL (ref 200–950)
Basophils Absolute: 47 cells/uL (ref 0–200)
Basophils Relative: 0.6 %
Eosinophils Absolute: 101 cells/uL (ref 15–500)
Eosinophils Relative: 1.3 %
HCT: 42.7 % (ref 38.5–50.0)
Hemoglobin: 14.6 g/dL (ref 13.2–17.1)
Lymphs Abs: 2036 cells/uL (ref 850–3900)
MCH: 31.1 pg (ref 27.0–33.0)
MCHC: 34.2 g/dL (ref 32.0–36.0)
MCV: 90.9 fL (ref 80.0–100.0)
MPV: 11.2 fL (ref 7.5–12.5)
Monocytes Relative: 6.3 %
Neutro Abs: 5125 cells/uL (ref 1500–7800)
Neutrophils Relative %: 65.7 %
Platelets: 264 10*3/uL (ref 140–400)
RBC: 4.7 10*6/uL (ref 4.20–5.80)
RDW: 12.5 % (ref 11.0–15.0)
Total Lymphocyte: 26.1 %
WBC: 7.8 10*3/uL (ref 3.8–10.8)

## 2019-07-28 LAB — COMPLETE METABOLIC PANEL WITH GFR
AG Ratio: 1.7 (calc) (ref 1.0–2.5)
ALT: 33 U/L (ref 9–46)
AST: 22 U/L (ref 10–40)
Albumin: 4.5 g/dL (ref 3.6–5.1)
Alkaline phosphatase (APISO): 59 U/L (ref 36–130)
BUN: 10 mg/dL (ref 7–25)
CO2: 22 mmol/L (ref 20–32)
Calcium: 9.8 mg/dL (ref 8.6–10.3)
Chloride: 106 mmol/L (ref 98–110)
Creat: 1.12 mg/dL (ref 0.60–1.35)
GFR, Est African American: 90 mL/min/{1.73_m2} (ref >=60)
GFR, Est Non African American: 78 mL/min/{1.73_m2} (ref >=60)
Globulin: 2.7 g/dL (calc) (ref 1.9–3.7)
Glucose, Bld: 115 mg/dL — ABNORMAL HIGH (ref 65–99)
Potassium: 4 mmol/L (ref 3.5–5.3)
Sodium: 141 mmol/L (ref 135–146)
Total Bilirubin: 0.6 mg/dL (ref 0.2–1.2)
Total Protein: 7.2 g/dL (ref 6.1–8.1)

## 2019-07-28 LAB — TSH: TSH: 1.39 mIU/L (ref 0.40–4.50)

## 2019-07-28 LAB — SEDIMENTATION RATE: Sed Rate: 2 mm/h (ref 0–15)

## 2019-08-08 ENCOUNTER — Encounter: Payer: Self-pay | Admitting: Family Medicine

## 2019-08-08 DIAGNOSIS — M79629 Pain in unspecified upper arm: Secondary | ICD-10-CM

## 2019-08-08 DIAGNOSIS — M542 Cervicalgia: Secondary | ICD-10-CM

## 2019-08-14 ENCOUNTER — Other Ambulatory Visit: Payer: Self-pay | Admitting: Family Medicine

## 2019-08-14 ENCOUNTER — Encounter: Payer: Self-pay | Admitting: Family Medicine

## 2019-08-14 DIAGNOSIS — M79629 Pain in unspecified upper arm: Secondary | ICD-10-CM

## 2019-08-14 NOTE — Telephone Encounter (Signed)
Patient has a follow up with [facility] at 11 am on 08/22/2019. Patient is aware.

## 2019-08-22 ENCOUNTER — Other Ambulatory Visit: Payer: Self-pay

## 2019-08-22 ENCOUNTER — Other Ambulatory Visit: Payer: BC Managed Care – PPO

## 2019-08-22 ENCOUNTER — Emergency Department
Admission: EM | Admit: 2019-08-22 | Discharge: 2019-08-22 | Disposition: A | Discharge status: Home / Self Care | Payer: BC Managed Care – PPO | Source: Home / Self Care | Attending: Family Medicine | Admitting: Family Medicine

## 2019-08-22 DIAGNOSIS — Z20822 Contact with and (suspected) exposure to covid-19: Secondary | ICD-10-CM

## 2019-08-22 DIAGNOSIS — Z20828 Contact with and (suspected) exposure to other viral communicable diseases: Secondary | ICD-10-CM | POA: Diagnosis not present

## 2019-08-22 DIAGNOSIS — Z23 Encounter for immunization: Secondary | ICD-10-CM | POA: Diagnosis not present

## 2019-08-22 MED ORDER — TETANUS-DIPHTH-ACELL PERTUSSIS 5-2.5-18.5 LF-MCG/0.5 IM SUSP
0.5000 mL | Freq: Once | INTRAMUSCULAR | Status: AC
  Administered 2019-08-22: 11:00:00 0.5 mL via INTRAMUSCULAR

## 2019-08-22 NOTE — ED Triage Notes (Signed)
Pt here today for COVID testing. Daughter tested pos on Sunday. Currently no sxs. Also needs tdap booster. Family member is pregnant.

## 2019-08-22 NOTE — ED Provider Notes (Signed)
[name]    CSN: [id number] Arrival date & time: 08/22/19  1011      History   Chief Complaint Chief Complaint  Patient presents with  . COVID testing  . Tdap booster    HPI Johnny Ramirez is a 48 y.o. male.   Patient presents for COVID19 testing.  His daughter tested positive 3 days ago.  He is completely assymptomatic at present.  He also requests a Tdap.  The history is provided by the patient.    History reviewed. No pertinent past medical history.  Patient Active Problem List   Diagnosis Date Noted  . OSA (obstructive sleep apnea) 11/27/2018  . Varicocele 06/10/2014    Past Surgical History:  Procedure Laterality Date  . APPENDECTOMY     as a child  . HERNIA REPAIR     as a child  . INGUINAL HERNIA REPAIR  2011       Home Medications    Prior to Admission medications   Medication Sig Start Date End Date Taking? Authorizing Provider  AMBULATORY NON FORMULARY MEDICATION Medication Name: CPAP set to 9 cm water pressure with humidifier and supplies. Please fit for mask.  Dx OSA. 11/27/18   Hali Marry, MD  amoxicillin-clavulanate (AUGMENTIN) 875-125 MG tablet Take 1 tablet by mouth 2 (two) times daily. 08/15/19   [provider]  Multiple Vitamins-Minerals (MENS MULTI VITAMIN & MINERAL PO) Take 1 tablet by mouth daily.    [provider]    Family History Family History  Problem Relation Age of Onset  . Diabetes Other   . Cancer Father 37    Social History Social History   Tobacco Use  . Smoking status: Never Smoker  . Smokeless tobacco: Never Used  Substance Use Topics  . Alcohol use: Yes    Alcohol/week: 8.0 standard drinks    Types: 8 Standard drinks or equivalent per week  . Drug use: No     Allergies   Patient has no known allergies.   Review of Systems Review of Systems No sore throat No cough No pleuritic pain No wheezing No nasal congestion No post-nasal drainage No sinus  pain/pressure No itchy/red eyes No earache No hemoptysis No SOB No fever/chills No nausea No vomiting No abdominal pain No diarrhea No urinary symptoms No skin rash No fatigue No myalgias No headache   Physical Exam Triage Vital Signs ED Triage Vitals  Enc Vitals Group     BP 08/22/19 1039 (!) 144/83     Pulse Rate 08/22/19 1039 88     Resp 08/22/19 1039 16     Temp 08/22/19 1039 99.1 F (37.3 C)     Temp Source 08/22/19 1039 Oral     SpO2 08/22/19 1039 97 %     Weight 08/22/19 1040 235 lb 14.3 oz (107 kg)     Height 08/22/19 1039 6' (1.829 m)     Head Circumference --      Peak Flow --      Pain Score --      Pain Loc --      Pain Edu? --      Excl. in [place]? --    No data found.  Updated Vital Signs BP (!) 144/83 (BP Location: Right Arm)   Pulse 88   Temp 99.1 F (37.3 C) (Oral)   Resp 16   Ht 6' (1.829 m)   Wt 107 kg   SpO2 97%   BMI 31.99 kg/m  Visual Acuity Right Eye Distance:   Left Eye Distance:   Bilateral Distance:    Right Eye Near:   Left Eye Near:    Bilateral Near:     Physical Exam Vitals signs and nursing note reviewed.  Constitutional:      General: He is not in acute distress. Eyes:     Pupils: Pupils are equal, round, and reactive to light.  Cardiovascular:     Rate and Rhythm: Normal rate.  Pulmonary:     Effort: Pulmonary effort is normal.  Neurological:     Mental Status: He is alert.   Patient not examined otherwise.   UC Treatments / Results  Labs (all labs ordered are listed, but only abnormal results are displayed) Labs Reviewed  SARS-COV-2 RNA,(COVID-19) QUALITATIVE NAAT    EKG   Radiology No results found.  Procedures Procedures (including critical care time)  Medications Ordered in UC Medications  Tdap (BOOSTRIX) injection 0.5 mL (0.5 mLs Intramuscular Given 08/22/19 1031)    Initial Impression / Assessment and Plan / UC Course  I have reviewed the triage vital signs and the nursing notes.   Pertinent labs & imaging results that were available during my care of the patient were reviewed by me and considered in my medical decision making (see chart for details).    Patient assymptomatic at present.  COVID19 send out. Administered Tdap    Final Clinical Impressions(s) / UC Diagnoses   Final diagnoses:  Close exposure to COVID-19 virus     Discharge Instructions     Isolate yourself until COVID-19 test result is available.   If COVID-19 test is positive, isolate yourself until the below conditions are met: 1)  At least 7 days since symptoms onset. AND 2)  > 72 hours after symptom resolution (absence of fever without the use of fever-reducing medicine, and improvement in respiratory symptoms.       ED Prescriptions    None        Kandra Nicolas, MD 08/22/19 1052

## 2019-08-22 NOTE — Discharge Instructions (Addendum)
Isolate yourself until COVID-19 test result is available.   If COVID-19 test is positive, isolate yourself until the below conditions are met: 1)  At least 7 days since symptoms onset. AND 2)  > 72 hours after symptom resolution (absence of fever without the use of fever-reducing medicine, and improvement in respiratory symptoms.

## 2019-08-24 ENCOUNTER — Ambulatory Visit: Payer: BC Managed Care – PPO

## 2019-08-25 LAB — SARS-COV-2 RNA,(COVID-19) QUALITATIVE NAAT: SARS CoV2 RNA: NOT DETECTED

## 2019-08-31 ENCOUNTER — Other Ambulatory Visit: Payer: Self-pay

## 2019-08-31 ENCOUNTER — Ambulatory Visit: Payer: BC Managed Care – PPO

## 2019-08-31 ENCOUNTER — Encounter: Payer: Self-pay | Admitting: Family Medicine

## 2019-08-31 ENCOUNTER — Ambulatory Visit
Admission: RE | Admit: 2019-08-31 | Discharge: 2019-08-31 | Disposition: A | Discharge status: Home / Self Care | Payer: BC Managed Care – PPO | Source: Ambulatory Visit | Attending: Family Medicine | Admitting: Family Medicine

## 2019-08-31 DIAGNOSIS — M79629 Pain in unspecified upper arm: Secondary | ICD-10-CM

## 2019-09-03 NOTE — Telephone Encounter (Signed)
I put in new order but not sure of locations. [facility] again.   I know I put this in before,  so I am not sure why it is a bilat mammogram which is weird.

## 2019-09-03 NOTE — Telephone Encounter (Signed)
Called the breast center and spoke with Noelia. She states that patient had diagnostic mammogram because it includes those areas/lymph nodes. She states that they will be able to do U/S of axilla if desired, advised her that yes we wanted this down. Orders signed. FYI to Dr Madilyn Fireman

## 2019-09-20 ENCOUNTER — Other Ambulatory Visit: Payer: Self-pay

## 2019-09-20 ENCOUNTER — Ambulatory Visit
Admission: RE | Admit: 2019-09-20 | Discharge: 2019-09-20 | Disposition: A | Discharge status: Home / Self Care | Payer: BC Managed Care – PPO | Source: Ambulatory Visit | Attending: Family Medicine | Admitting: Family Medicine

## 2019-09-20 ENCOUNTER — Other Ambulatory Visit: Payer: BC Managed Care – PPO

## 2019-09-20 DIAGNOSIS — M79629 Pain in unspecified upper arm: Secondary | ICD-10-CM

## 2019-09-21 ENCOUNTER — Other Ambulatory Visit: Payer: Self-pay | Admitting: *Deleted

## 2019-09-21 DIAGNOSIS — M542 Cervicalgia: Secondary | ICD-10-CM

## 2019-09-26 ENCOUNTER — Ambulatory Visit (INDEPENDENT_AMBULATORY_CARE_PROVIDER_SITE_OTHER): Payer: BC Managed Care – PPO

## 2019-09-26 ENCOUNTER — Other Ambulatory Visit: Payer: Self-pay

## 2019-09-26 DIAGNOSIS — M542 Cervicalgia: Secondary | ICD-10-CM

## 2019-09-28 ENCOUNTER — Other Ambulatory Visit: Payer: BC Managed Care – PPO

## 2020-01-15 ENCOUNTER — Other Ambulatory Visit: Payer: Self-pay

## 2020-01-15 ENCOUNTER — Emergency Department (INDEPENDENT_AMBULATORY_CARE_PROVIDER_SITE_OTHER): Payer: BC Managed Care – PPO

## 2020-01-15 ENCOUNTER — Emergency Department
Admission: EM | Admit: 2020-01-15 | Discharge: 2020-01-15 | Disposition: A | Discharge status: Home / Self Care | Payer: BC Managed Care – PPO | Source: Home / Self Care | Attending: Family Medicine | Admitting: Family Medicine

## 2020-01-15 DIAGNOSIS — R0602 Shortness of breath: Secondary | ICD-10-CM

## 2020-01-15 DIAGNOSIS — R05 Cough: Secondary | ICD-10-CM | POA: Diagnosis not present

## 2020-01-15 DIAGNOSIS — R053 Chronic cough: Secondary | ICD-10-CM

## 2020-01-15 MED ORDER — OMEPRAZOLE 20 MG PO CPDR: DELAYED_RELEASE_CAPSULE | ORAL | 1 refills | Status: DC

## 2020-01-15 MED ORDER — PREDNISONE 20 MG PO TABS
ORAL_TABLET | ORAL | 0 refills | Status: DC
Start: 2020-01-15 — End: 2020-02-08

## 2020-01-15 NOTE — ED Triage Notes (Signed)
Patient presents to Urgent Care with complaints of sob since about 4-5 weeks ago, progressively worse. Patient reports he thought it was his allergies but it has not gone away. Pt states he has a productive cough in the mornings. Pt has not tried otc remedies, does not have an inhaler.

## 2020-01-15 NOTE — Discharge Instructions (Addendum)
Try taking a non-sedating allergy medication such as Zyrtec (cetirizine) 10mg , once daily.

## 2020-01-15 NOTE — ED Provider Notes (Signed)
[facility]    CSN: [id number] Arrival date & time: 01/15/20  [id number]      History   Chief Complaint Chief Complaint  Patient presents with  . Shortness of Breath    HPI Johnny Ramirez is a 49 y.o. male.   Patient reports that he has had increased allergy symptoms for one month.  During this time he has had intermittent shortness of breath that has become worse during the past week.  He describes his dyspnea as not being able to take deep breath and tightness/stabbing in his anterior chest.  He denies fevers, chills, and sweats.  He has a cough in the morning and night which decreases during the day.  He has had negative evaluations for this problem in the past, and has had a poor response to an albuterol inhaler.  He uses CPAP for his OSA. He denies known history of heartburn and GERD  The history is provided by the patient.    History reviewed. No pertinent past medical history.  Patient Active Problem List   Diagnosis Date Noted  . OSA (obstructive sleep apnea) 11/27/2018  . Varicocele 06/10/2014    Past Surgical History:  Procedure Laterality Date  . APPENDECTOMY     as a child  . HERNIA REPAIR     as a child  . INGUINAL HERNIA REPAIR  2011       Home Medications    Prior to Admission medications   Medication Sig Start Date End Date Taking? Authorizing Provider  AMBULATORY NON FORMULARY MEDICATION Medication Name: CPAP set to 9 cm water pressure with humidifier and supplies. Please fit for mask.  Dx OSA. 11/27/18   Hali Marry, MD  amoxicillin-clavulanate (AUGMENTIN) 875-125 MG tablet Take 1 tablet by mouth 2 (two) times daily. 08/15/19   [provider]  Multiple Vitamins-Minerals (MENS MULTI VITAMIN & MINERAL PO) Take 1 tablet by mouth daily.    [provider]  omeprazole (PRILOSEC) 20 MG capsule Take one cap PO once daily, 30 minutes AC 01/15/20   Kandra Nicolas, MD  predniSONE (DELTASONE) 20 MG tablet Take one tab by  mouth twice daily for 4 days, then one daily for 3 days. Take with food. 01/15/20   Kandra Nicolas, MD    Family History Family History  Problem Relation Age of Onset  . Diabetes Other   . Cancer Father 38    Social History Social History   Tobacco Use  . Smoking status: Never Smoker  . Smokeless tobacco: Never Used  Substance Use Topics  . Alcohol use: Yes    Alcohol/week: 8.0 standard drinks    Types: 8 Standard drinks or equivalent per week    Comment: weekly  . Drug use: No     Allergies   Patient has no known allergies.   Review of Systems Review of Systems No sore throat + cough No pleuritic pain but feels tight in his anterior chest No wheezing + nasal congestion No post-nasal drainage No sinus pain/pressure No itchy/red eyes No earache No hemoptysis + SOB No fever/chills No nausea No vomiting No abdominal pain No diarrhea No urinary symptoms No skin rash No fatigue No myalgias No leg pain, swelling, or edema No headache   Physical Exam Triage Vital Signs ED Triage Vitals  Enc Vitals Group     BP 01/15/20 0938 123/83     Pulse Rate 01/15/20 0938 72     Resp 01/15/20 0938 20  Temp 01/15/20 0938 99 F (37.2 C)     Temp Source 01/15/20 0938 Oral     SpO2 01/15/20 0938 98 %     Weight 01/15/20 1017 235 lb 12.8 oz (107 kg)     Height --      Head Circumference --      Peak Flow --      Pain Score 01/15/20 0937 0     Pain Loc --      Pain Edu? --      Excl. in [place]? --    No data found.  Updated Vital Signs BP 123/83 (BP Location: Right Arm)   Pulse 72   Temp 99 F (37.2 C) (Oral)   Resp 20   Wt 107 kg   SpO2 98%   BMI 31.98 kg/m   Visual Acuity Right Eye Distance:   Left Eye Distance:   Bilateral Distance:    Right Eye Near:   Left Eye Near:    Bilateral Near:     Physical Exam Nursing notes and Vital Signs reviewed. Appearance:  Patient appears stated age, and in no acute distress Eyes:  Pupils are equal, round,  and reactive to light and accomodation.  Extraocular movement is intact.  Conjunctivae are not inflamed  Ears:  Canals normal.  Tympanic membranes normal.  Nose:  Mildly congested turbinates.  No sinus tenderness.   Pharynx:  Normal Neck:  Supple.  No adenopathy   Lungs:  Clear to auscultation.  Breath sounds are equal.  Moving air well. Chest:  No tenderness to palpation. Heart:  Regular rate and rhythm without murmurs, rubs, or gallops.  Abdomen:  Nontender without masses or hepatosplenomegaly.  Bowel sounds are present.  No CVA or flank tenderness.  Extremities:  No edema. No lower leg tenderness to palpation  Skin:  No rash present.   UC Treatments / Results  Labs (all labs ordered are listed, but only abnormal results are displayed) Labs Reviewed - No data to display  EKG   Radiology DG Chest 2 View  Result Date: 01/15/2020 CLINICAL DATA:  Increased cough and shortness of breath EXAM: CHEST - 2 VIEW COMPARISON:  2019 FINDINGS: There is no new consolidation or edema. No pleural effusion or pneumothorax. Cardiomediastinal contours remain within normal limits. No acute osseous abnormality. IMPRESSION: No acute process in the chest. Electronically Signed   By: Macy Mis M.D.   On: 01/15/2020 11:16    Procedures Procedures (including critical care time)  Medications Ordered in UC Medications - No data to display  Initial Impression / Assessment and Plan / UC Course  I have reviewed the triage vital signs and the nursing notes.  Pertinent labs & imaging results that were available during my care of the patient were reviewed by me and considered in my medical decision making (see chart for details).    Normal exam and chest X-ray reassuring.  Patient has had thorough evaluation in the past also. ?GERD. ?allergy induced.  Begin trial of omeprazole.   Recommend evaluation by pulmonologist.   Final Clinical Impressions(s) / UC Diagnoses   Final diagnoses:  Cough,  persistent  SOB (shortness of breath)     Discharge Instructions     Try taking a non-sedating allergy medication such as Zyrtec (cetirizine) 10mg , once daily.    ED Prescriptions    Medication Sig Dispense Auth. Provider   predniSONE (DELTASONE) 20 MG tablet Take one tab by mouth twice daily for 4 days, then one daily for  3 days. Take with food. 11 tablet Kandra Nicolas, MD   omeprazole (PRILOSEC) 20 MG capsule Take one cap PO once daily, 30 minutes AC 15 capsule Kandra Nicolas, MD        Kandra Nicolas, MD 01/16/20 [phone]

## 2020-01-22 ENCOUNTER — Encounter: Payer: Self-pay | Admitting: Family Medicine

## 2020-01-24 ENCOUNTER — Ambulatory Visit (INDEPENDENT_AMBULATORY_CARE_PROVIDER_SITE_OTHER): Payer: BC Managed Care – PPO | Admitting: Family Medicine

## 2020-01-24 ENCOUNTER — Encounter: Payer: Self-pay | Admitting: Family Medicine

## 2020-01-24 VITALS — BP 123/83 | HR 72 | Ht 72.0 in | Wt 235.0 lb

## 2020-01-24 DIAGNOSIS — R0602 Shortness of breath: Secondary | ICD-10-CM | POA: Diagnosis not present

## 2020-01-24 NOTE — Progress Notes (Signed)
Virtual Visit via Video Note  I connected with Johnny Ramirez on 01/24/20 at  9:10 AM EDT by a video enabled telemedicine application and verified that I am speaking with the correct person using two identifiers.   I discussed the limitations of evaluation and management by telemedicine and the availability of in person appointments. The patient expressed understanding and agreed to proceed.    Acute Office Visit  Subjective:    Patient ID: Johnny Ramirez, male    DOB: 12-08-1970, 49 y.o.   MRN: [id number]  Chief Complaint  Patient presents with  . Follow-up    HPI Patient is in today for Shortness of breath and chronic cough he was actually just seen at urgent care on May 4 persist for persistent symptoms.  He was given prednisone and started on omeprazole for possible GERD symptoms.  Looking back through his history over a year or 2 he actually has been seen and evaluated for shortness of breath.   Interestingly he is always had a negative work-up and is never had a great response to albuterol.  Chest x-ray at urgent care on May 4 was normal. He is 70 % better today.  He says initially was so severe he could not sleep.  He says it almost just feels like this cold stabbing sensation in his upper throat when he tries to take a deep breath in.  He denies any cough.  He has had this on and off for years.  Please see prior notes.  He says it was bad enough he really could not wear his CPAP for the last few nights he has been taking omeprazole.  He says he actually takes it on and off anyway and usually gets the over-the-counter medication because he does have some occasional heartburn symptoms.  No past medical history on file.  Past Surgical History:  Procedure Laterality Date  . APPENDECTOMY     as a child  . HERNIA REPAIR     as a child  . INGUINAL HERNIA REPAIR  2011    Family History  Problem Relation Age of Onset  . Diabetes Other   . Cancer Father 37    Social History    Socioeconomic History  . Marital status: Single    Spouse name: Not on file  . Number of children: Not on file  . Years of education: Not on file  . Highest education level: Not on file  Occupational History  . Not on file  Tobacco Use  . Smoking status: Never Smoker  . Smokeless tobacco: Never Used  Substance and Sexual Activity  . Alcohol use: Yes    Alcohol/week: 8.0 standard drinks    Types: 8 Standard drinks or equivalent per week    Comment: weekly  . Drug use: No  . Sexual activity: Not on file  Other Topics Concern  . Not on file  Social History Narrative   No regular exercise.        Social Determinants of Health   Financial Resource Strain:   . Difficulty of Paying Living Expenses:   Food Insecurity:   . Worried About Charity fundraiser in the Last Year:   . Arboriculturist in the Last Year:   Transportation Needs:   . Film/video editor (Medical):   Marland Kitchen Lack of Transportation (Non-Medical):   Physical Activity:   . Days of Exercise per Week:   . Minutes of Exercise per Session:   Stress:   .  Feeling of Stress :   Social Connections:   . Frequency of Communication with Friends and Family:   . Frequency of Social Gatherings with Friends and Family:   . Attends Religious Services:   . Active Member of Clubs or Organizations:   . Attends Archivist Meetings:   Marland Kitchen Marital Status:   Intimate Partner Violence:   . Fear of Current or Ex-Partner:   . Emotionally Abused:   Marland Kitchen Physically Abused:   . Sexually Abused:     Outpatient Medications Prior to Visit  Medication Sig Dispense Refill  . AMBULATORY NON FORMULARY MEDICATION Medication Name: CPAP set to 9 cm water pressure with humidifier and supplies. Please fit for mask.  Dx OSA. 1 vial 0  . Multiple Vitamins-Minerals (MENS MULTI VITAMIN & MINERAL PO) Take 1 tablet by mouth daily.    Marland Kitchen omeprazole (PRILOSEC) 20 MG capsule Take one cap PO once daily, 30 minutes AC 15 capsule 1  . predniSONE  (DELTASONE) 20 MG tablet Take one tab by mouth twice daily for 4 days, then one daily for 3 days. Take with food. 11 tablet 0  . amoxicillin-clavulanate (AUGMENTIN) 875-125 MG tablet Take 1 tablet by mouth 2 (two) times daily.     No facility-administered medications prior to visit.    No Known Allergies  Review of Systems     Objective:    Physical Exam  BP 123/83   Pulse 72   Ht 6' (1.829 m)   Wt 235 lb (106.6 kg)   BMI 31.87 kg/m  Wt Readings from Last 3 Encounters:  01/24/20 235 lb (106.6 kg)  01/15/20 235 lb 12.8 oz (107 kg)  08/22/19 235 lb 14.3 oz (107 kg)    There are no preventive care reminders to display for this patient.  There are no preventive care reminders to display for this patient.   Lab Results  Component Value Date   TSH 1.39 07/27/2019   Lab Results  Component Value Date   WBC 7.8 07/27/2019   HGB 14.6 07/27/2019   HCT 42.7 07/27/2019   MCV 90.9 07/27/2019   PLT 264 07/27/2019   Lab Results  Component Value Date   NA 141 07/27/2019   K 4.0 07/27/2019   CO2 22 07/27/2019   GLUCOSE 115 (H) 07/27/2019   BUN 10 07/27/2019   CREATININE 1.12 07/27/2019   BILITOT 0.6 07/27/2019   ALKPHOS 57 11/04/2016   AST 22 07/27/2019   ALT 33 07/27/2019   PROT 7.2 07/27/2019   ALBUMIN 4.5 11/04/2016   CALCIUM 9.8 07/27/2019   Lab Results  Component Value Date   CHOL 160 09/22/2018   Lab Results  Component Value Date   HDL 47 09/22/2018   Lab Results  Component Value Date   LDLCALC 99 09/22/2018   Lab Results  Component Value Date   TRIG 58 09/22/2018   Lab Results  Component Value Date   CHOLHDL 3.4 09/22/2018   Lab Results  Component Value Date   HGBA1C 5.6 09/22/2018       Assessment & Plan:   Problem List Items Addressed This Visit    None    Visit Diagnoses    SOB (shortness of breath)    -  Primary     Again 70% improved which is great he feels like he is responding really well to the prednisone.  Continue the PPI  as well.  We will schedule him for spirometry.  Consider may need additional work-up including  CAT scan of the chest in the future.  No orders of the defined types were placed in this encounter.    Time spent in encounter 22 minutes  I discussed the assessment and treatment plan with the patient. The patient was provided an opportunity to ask questions and all were answered. The patient agreed with the plan and demonstrated an understanding of the instructions.   The patient was advised to call back or seek an in-person evaluation if the symptoms worsen or if the condition fails to improve as anticipated.   Beatrice Lecher, MD   Beatrice Lecher, MD

## 2020-01-24 NOTE — Progress Notes (Signed)
He was seen at [facility] for SOB. He stated that it when he takes a deep breath he feels a sharp pain in his throat. He does feel that the Prednisone has helped with this.  He said that if he breaths normal he doesn't have any issues. If he is exerting himself he doesn't have any problems with the pain. The pain only occurs whenever he takes a deep breath and upon waking in the morning. He does have seasonal allergies that bother him and he was advised to start taking Zyrtec but he is unsure if this will actually help. He was also advised to speak w/Dr. Madilyn Fireman about getting a prescription for Omeprazole since this is covered by his insurance and he pays more for this if he purchases this OTC.

## 2020-02-08 ENCOUNTER — Ambulatory Visit (INDEPENDENT_AMBULATORY_CARE_PROVIDER_SITE_OTHER): Payer: BC Managed Care – PPO | Admitting: Family Medicine

## 2020-02-08 VITALS — BP 109/71 | HR 84 | Temp 98.0°F | Wt 234.0 lb

## 2020-02-08 DIAGNOSIS — R0600 Dyspnea, unspecified: Secondary | ICD-10-CM

## 2020-02-08 MED ORDER — OMEPRAZOLE 20 MG PO CPDR: DELAYED_RELEASE_CAPSULE | ORAL | 3 refills | Status: DC

## 2020-02-08 MED ORDER — ALBUTEROL SULFATE HFA 108 (90 BASE) MCG/ACT IN AERS
2.0000 | INHALATION_SPRAY | Freq: Once | RESPIRATORY_TRACT | Status: AC
  Administered 2020-02-08: 2 via RESPIRATORY_TRACT

## 2020-02-08 NOTE — Progress Notes (Signed)
Established Patient Office Visit  Subjective:  Patient ID: Johnny Ramirez, male    DOB: 10-19-1970  Age: 49 y.o. MRN: [id number]  CC: No chief complaint on file.   HPI Johnny Ramirez presents for from she to follow-up on shortness of breath.  Please see previous office visit note from 5/13. Upper airwary issues,  Sharp pain near supraclavivular notch in throat followed by cough.  Already on PPI. Already seen ENT.  History reviewed. No pertinent past medical history.  Past Surgical History:  Procedure Laterality Date  . APPENDECTOMY     as a child  . HERNIA REPAIR     as a child  . INGUINAL HERNIA REPAIR  2011    Family History  Problem Relation Age of Onset  . Diabetes Other   . Cancer Father 28    Social History   Socioeconomic History  . Marital status: Single    Spouse name: Not on file  . Number of children: Not on file  . Years of education: Not on file  . Highest education level: Not on file  Occupational History  . Not on file  Tobacco Use  . Smoking status: Never Smoker  . Smokeless tobacco: Never Used  Substance and Sexual Activity  . Alcohol use: Yes    Alcohol/week: 8.0 standard drinks    Types: 8 Standard drinks or equivalent per week    Comment: weekly  . Drug use: No  . Sexual activity: Not on file  Other Topics Concern  . Not on file  Social History Narrative   No regular exercise.        Social Determinants of Health   Financial Resource Strain:   . Difficulty of Paying Living Expenses:   Food Insecurity:   . Worried About Charity fundraiser in the Last Year:   . Arboriculturist in the Last Year:   Transportation Needs:   . Film/video editor (Medical):   Marland Kitchen Lack of Transportation (Non-Medical):   Physical Activity:   . Days of Exercise per Week:   . Minutes of Exercise per Session:   Stress:   . Feeling of Stress :   Social Connections:   . Frequency of Communication with Friends and Family:   . Frequency of Social  Gatherings with Friends and Family:   . Attends Religious Services:   . Active Member of Clubs or Organizations:   . Attends Archivist Meetings:   Marland Kitchen Marital Status:   Intimate Partner Violence:   . Fear of Current or Ex-Partner:   . Emotionally Abused:   Marland Kitchen Physically Abused:   . Sexually Abused:     Outpatient Medications Prior to Visit  Medication Sig Dispense Refill  . AMBULATORY NON FORMULARY MEDICATION Medication Name: CPAP set to 9 cm water pressure with humidifier and supplies. Please fit for mask.  Dx OSA. 1 vial 0  . Multiple Vitamins-Minerals (MENS MULTI VITAMIN & MINERAL PO) Take 1 tablet by mouth daily.    Marland Kitchen omeprazole (PRILOSEC) 20 MG capsule Take one cap PO once daily, 30 minutes AC 15 capsule 1  . predniSONE (DELTASONE) 20 MG tablet Take one tab by mouth twice daily for 4 days, then one daily for 3 days. Take with food. 11 tablet 0   No facility-administered medications prior to visit.    No Known Allergies  ROS Review of Systems    Objective:    Physical Exam  Constitutional: He is oriented to person, place,  and time. He appears well-developed and well-nourished.  HENT:  Head: Normocephalic and atraumatic.  Cardiovascular: Normal rate, regular rhythm and normal heart sounds.  Pulmonary/Chest: Effort normal and breath sounds normal.  Neurological: He is alert and oriented to person, place, and time.  Skin: Skin is warm and dry.  Psychiatric: He has a normal mood and affect. His behavior is normal.    BP 109/71 (BP Location: Left Arm, Patient Position: Sitting, Cuff Size: Normal)   Pulse 84   Temp 98 F (36.7 C) (Oral)   Wt 234 lb (106.1 kg)   BMI 31.74 kg/m  Wt Readings from Last 3 Encounters:  02/08/20 234 lb (106.1 kg)  01/24/20 235 lb (106.6 kg)  01/15/20 235 lb 12.8 oz (107 kg)     There are no preventive care reminders to display for this patient.  There are no preventive care reminders to display for this patient.  Lab Results   Component Value Date   TSH 1.39 07/27/2019   Lab Results  Component Value Date   WBC 7.8 07/27/2019   HGB 14.6 07/27/2019   HCT 42.7 07/27/2019   MCV 90.9 07/27/2019   PLT 264 07/27/2019   Lab Results  Component Value Date   NA 141 07/27/2019   K 4.0 07/27/2019   CO2 22 07/27/2019   GLUCOSE 115 (H) 07/27/2019   BUN 10 07/27/2019   CREATININE 1.12 07/27/2019   BILITOT 0.6 07/27/2019   ALKPHOS 57 11/04/2016   AST 22 07/27/2019   ALT 33 07/27/2019   PROT 7.2 07/27/2019   ALBUMIN 4.5 11/04/2016   CALCIUM 9.8 07/27/2019   Lab Results  Component Value Date   CHOL 160 09/22/2018   Lab Results  Component Value Date   HDL 47 09/22/2018   Lab Results  Component Value Date   LDLCALC 99 09/22/2018   Lab Results  Component Value Date   TRIG 58 09/22/2018   Lab Results  Component Value Date   CHOLHDL 3.4 09/22/2018   Lab Results  Component Value Date   HGBA1C 5.6 09/22/2018      Assessment & Plan:   Problem List Items Addressed This Visit    None    Visit Diagnoses    Dyspnea, unspecified type    -  Primary   Relevant Medications   albuterol (VENTOLIN HFA) 108 (90 Base) MCG/ACT inhaler 2 puff (Completed)   Other Relevant Orders   PR EVAL OF BRONCHOSPASM   Ambulatory referral to Pulmonology     Spirometry shows FVC of 99% with FEV1 of 109% with a ratio of 85%.  No significant improvement after use of albuterol. Reviewed results with him today.  In regards to shortness of breath I think this is reassuring as it is completely normal.  But again he is describing what sounds almost more like an upper airway issue.  He is already seen ENT for this he already takes a PPI regularly.  So at this point a medical head and refer him to pulmonary for possible upper airway issue.  He understands that it is probably nothing life-threatening or harmful but just wants to know what he can do to make it better because it is really disruptive to him being able to wear his CPAP  throughout the entire night.  Meds ordered this encounter  Medications  . albuterol (VENTOLIN HFA) 108 (90 Base) MCG/ACT inhaler 2 puff  . omeprazole (PRILOSEC) 20 MG capsule    Sig: Take one cap PO once daily, 30  minutes AC    Dispense:  90 capsule    Refill:  3    Follow-up: No follow-ups on file.    Beatrice Lecher, MD

## 2020-02-21 ENCOUNTER — Ambulatory Visit (INDEPENDENT_AMBULATORY_CARE_PROVIDER_SITE_OTHER): Payer: BC Managed Care – PPO | Admitting: Internal Medicine

## 2020-02-21 ENCOUNTER — Encounter: Payer: Self-pay | Admitting: Internal Medicine

## 2020-02-21 ENCOUNTER — Other Ambulatory Visit: Payer: Self-pay

## 2020-02-21 DIAGNOSIS — R05 Cough: Secondary | ICD-10-CM

## 2020-02-21 DIAGNOSIS — R058 Other specified cough: Secondary | ICD-10-CM

## 2020-02-21 HISTORY — DX: Other specified cough: R05.8

## 2020-02-21 MED ORDER — AMOXICILLIN-POT CLAVULANATE 875-125 MG PO TABS: 1.0000 | ORAL_TABLET | Freq: Two times a day (BID) | ORAL | 0 refills | Status: AC

## 2020-02-21 MED ORDER — PANTOPRAZOLE SODIUM 40 MG PO TBEC: DELAYED_RELEASE_TABLET | ORAL | 2 refills | Status: DC

## 2020-02-21 NOTE — Patient Instructions (Addendum)
Augmentin 875 mg take one pill twice daily  X 10 days - take at breakfast and supper with large glass of water.  It would help reduce the usual side effects (diarrhea and yeast infections) if you ate cultured yogurt at lunch.   Protonix 40 mg Take 30- 60 min before your first and last meals of the day   GERD (REFLUX)  is an extremely common cause of respiratory symptoms just like yours , many times with no obvious heartburn at all.    It can be treated with medication, but also with lifestyle changes including elevation of the head of your bed (ideally with 6 -8inch blocks under the headboard of your bed),  Smoking cessation, avoidance of late meals, excessive alcohol, and avoid fatty foods, chocolate, peppermint, colas, red wine, and acidic juices such as orange juice.  NO MINT OR MENTHOL PRODUCTS SO NO COUGH DROPS  USE SUGARLESS CANDY INSTEAD (Jolley ranchers or Stover's or Life Savers) or even ice chips will also do - the key is to swallow to prevent all throat clearing. NO OIL BASED VITAMINS - use powdered substitutes.  Avoid fish oil when coughing.  Please remember to go to the lab department   for your tests - we will call you with the results when they are available.     If not improving over new week or two I would return to see your ENT doctor but show him the meds I recommended   Pulmonary follow up is as needed

## 2020-02-21 NOTE — Assessment & Plan Note (Addendum)
Onset 2016 daily throat clearing "whenever awake" -  Allergy profile 02/21/2020 >  Eos 0. /  IgE   -  Spirometry nl 02/08/20  -  Allergy profile 02/21/2020 >  Eos 0. /  IgE   -  02/21/2020 rx sinusitis/ gerd then ent eval to follow if not improving   Upper airway cough syndrome (previously labeled PNDS),  is so named because it's frequently impossible to sort out how much is  CR/sinusitis with freq throat clearing (which can be related to primary GERD)   vs  causing  secondary (" extra esophageal")  GERD from wide swings in gastric pressure that occur with throat clearing, often  promoting self use of mint and menthol lozenges that reduce the lower esophageal sphincter tone and exacerbate the problem further in a cyclical fashion.   These are the same pts (now being labeled as having "irritable larynx syndrome" by some cough centers) who not infrequently have a history of having failed to tolerate ace inhibitors,  dry powder inhalers or biphosphonates or report having atypical/extraesophageal reflux symptoms that don't respond to standard doses of PPI  and are easily confused as having aecopd or asthma flares by even experienced allergists/ pulmonologists (myself included).   Strongly suspect this all started with sinusitis then developed secondary gerd so rex augmentin x10 days and max rx for gerd then ent eval to follow / allergy eval if profile positive.   With nl ex tolerance strongly doubt a pulmonary or cardiac etiology for any of his non-specific chronic complaints that all point back to the upper airway         Each maintenance medication was reviewed in detail including emphasizing most importantly the difference between maintenance and prns and under what circumstances the prns are to be triggered using an action plan format where appropriate.  Total time for H and P, chart review, counseling, teaching device and generating customized AVS unique to this office consultation / charting = 60  min

## 2020-02-21 NOTE — Progress Notes (Signed)
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  Constantly aware of the need to clear the throat while awake x 2016 tendency to light green mucus.    History of Present Illness  02/21/2020  Pulmonary/ 1st office eval/Raivyn Kabler  Chief Complaint  Patient presents with  . Consult    SOB and cough in AM for 3 months  Dyspnea:  Walking up and down steps s cp, sob  Cough: clear most production in am p cpap overnight Sleep: flat  SABA use: did not help Prilosec prn - automatically one 30 min before lunch  No obvious day to day or daytime variability or assoc   mucus plugs or hemoptysis or cp or chest tightness, subjective wheeze or   hb symptoms.     Also denies any obvious fluctuation of symptoms with weather or environmental changes or other aggravating or alleviating factors except as outlined above   No unusual exposure hx or h/o childhood pna/ asthma or knowledge of premature birth.  Current Allergies, Complete Past Medical History, Past Surgical History, Family History, and Social History were reviewed in [employer] [facility] record.  ROS  The following are not active complaints unless bolded Hoarseness, sore throat, dysphagia, dental problems, itching, sneezing,  nasal congestion or discharge of excess mucus or purulent secretions, ear ache,   fever, chills, sweats, unintended wt loss or wt gain, classically pleuritic or  exertional cp,  orthopnea pnd or arm/hand swelling  or leg swelling, presyncope, palpitations, abdominal pain, anorexia, nausea, vomiting, diarrhea  or change in bowel habits or change in bladder habits, change in stools or change in urine, dysuria, hematuria,  rash, arthralgias, visual complaints, headache, numbness, weakness or ataxia or problems with walking or coordination,  change in mood or  memory.            History reviewed. No pertinent past medical history.  Outpatient Medications Prior to Visit  Medication Sig Dispense Refill  . AMBULATORY NON FORMULARY MEDICATION Medication Name: CPAP set to 9 cm water pressure with humidifier and supplies. Please fit for mask.  Dx OSA. 1 vial 0  . Multiple Vitamins-Minerals (MENS MULTI VITAMIN & MINERAL PO) Take 1 tablet by mouth daily.    Marland Kitchen omeprazole (PRILOSEC) 20 MG capsule Take one cap PO once daily, 30 minutes AC 90 capsule 3   No facility-administered medications prior to visit.     Objective:     BP 122/74 (BP Location: Left Arm, Cuff Size: Normal)   Pulse 74   Temp (!) 97.2 F (36.2 C) (Oral)   Ht 6' (1.829 m)   Wt 233 lb 6.4 oz (105.9 kg)   SpO2 97% Comment: RA  BMI 31.65 kg/m   SpO2: 97 % (RA)   Pleasant amb wm vigorously clearly throat sporadically    HEENT : pt wearing mask not removed for exam due to covid -19  concerns.    NECK :  without JVD/Nodes/TM/ nl carotid upstrokes bilaterally   LUNGS: no acc muscle use,  Nl contour chest which is clear to A and P bilaterally without cough on insp or exp maneuvers   CV:  RRR  no s3 or murmur or increase in P2, and no edema   ABD:  soft and nontender with nl inspiratory excursion in the supine position. No bruits or organomegaly appreciated, bowel sounds nl  MS:  Nl gait/ ext warm without deformities, calf tenderness, cyanosis or clubbing No obvious joint restrictions   SKIN: warm and dry without lesions    NEURO:  alert, approp, nl sensorium with  no motor or  cerebellar deficits apparent.      I personally reviewed images and agree with radiology impression as follows:  CXR:   01/15/20  Pa LAT No acute process in the chest.     Assessment   Upper airway cough syndrome Onset 2016 daily throat clearing "whenever awake" -  Allergy profile 02/21/2020 >  Eos 0. /  IgE   -  Spirometry nl 02/08/20  -  Allergy profile 02/21/2020 >  Eos 0. /  IgE   -  02/21/2020 rx sinusitis/ gerd then ent eval to follow if not improving   Upper airway cough syndrome (previously labeled PNDS),  is so named because it's frequently impossible to sort out how much is  CR/sinusitis with freq throat clearing (which can be related to primary GERD)   vs  causing  secondary (" extra esophageal")  GERD from wide swings in gastric pressure that occur with throat clearing, often  promoting self use of mint and menthol lozenges that reduce the lower esophageal sphincter tone and exacerbate the problem further in a cyclical fashion.   These are the same pts (now being labeled as having "irritable larynx syndrome" by some cough centers) who not infrequently have a history of having failed to tolerate ace inhibitors,  dry powder inhalers or biphosphonates or report having atypical/extraesophageal reflux symptoms that don't respond to standard doses of PPI  and are easily confused as having aecopd or asthma flares by even experienced allergists/ pulmonologists (myself included).   Strongly suspect this all started with sinusitis then developed secondary gerd so rex augmentin x10 days and max rx for gerd then ent eval to follow / allergy eval if profile positive.   With nl ex tolerance strongly doubt a pulmonary or cardiac etiology for any of his non-specific chronic complaints that all point back to the upper airway         Each maintenance medication was reviewed in detail including emphasizing most importantly the difference between maintenance and prns and under what circumstances the  prns are to be triggered using an action plan format where appropriate.  Total time for H and P, chart review, counseling, teaching device and generating customized AVS unique to this office consultation / charting = 60 min             Christinia Gully, MD 02/21/2020

## 2020-02-22 LAB — RESPIRATORY ALLERGY PROFILE REGION II ~~LOC~~
Allergen, A. alternata, m6: <0.1 kU/L
Allergen, Cedar tree, t12: <0.1 kU/L
Allergen, Comm Silver Birch, t9: <0.1 kU/L
Allergen, Cottonwood, t14: <0.1 kU/L
Allergen, D pternoyssinus,d7: 0.16 kU/L — ABNORMAL HIGH
Allergen, Mouse Urine Protein, e78: <0.1 kU/L
Allergen, Mulberry, t76: <0.1 kU/L
Allergen, Oak,t7: <0.1 kU/L
Allergen, P. notatum, m1: <0.1 kU/L
Aspergillus fumigatus, m3: <0.1 kU/L
Bermuda Grass: <0.1 kU/L
Box Elder IgE: <0.1 kU/L
CLADOSPORIUM HERBARUM (M2) IGE: <0.1 kU/L
COMMON RAGWEED (SHORT) (W1) IGE: <0.1 kU/L
Cat Dander: <0.1 kU/L
Class: 0
Class: 0
Class: 0
Class: 0
Class: 0
Class: 0
Class: 0
Class: 0
Class: 0
Class: 0
Class: 0
Class: 0
Class: 0
Class: 0
Class: 0
Class: 0
Class: 0
Class: 0
Class: 0
Class: 0
Class: 0
Cockroach: 0.14 kU/L — ABNORMAL HIGH
D. farinae: 0.14 kU/L — ABNORMAL HIGH
Dog Dander: <0.1 kU/L
Elm IgE: <0.1 kU/L
IgE (Immunoglobulin E), Serum: 64 kU/L (ref <=114)
Johnson Grass: <0.1 kU/L
Pecan/Hickory Tree IgE: <0.1 kU/L
Rough Pigweed  IgE: <0.1 kU/L
Sheep Sorrel IgE: <0.1 kU/L
Timothy Grass: <0.1 kU/L

## 2020-02-22 LAB — INTERPRETATION:

## 2020-02-25 NOTE — Progress Notes (Signed)
LMTCB

## 2020-02-26 NOTE — Progress Notes (Signed)
Spoke with pt and notified of results per Dr. Wert. Pt verbalized understanding and denied any questions. 

## 2020-03-11 ENCOUNTER — Encounter: Payer: Self-pay | Admitting: Family Medicine

## 2020-03-11 DIAGNOSIS — R058 Other specified cough: Secondary | ICD-10-CM

## 2020-03-11 NOTE — Telephone Encounter (Signed)
I called patient. He feels like something is going on in his throat. He would like to be referred to [facility]. Please advise. He has already seen Dr Madilyn Fireman and Pulmonology.

## 2020-03-11 NOTE — Telephone Encounter (Signed)
Referral pended for review

## 2020-03-28 ENCOUNTER — Emergency Department
Admission: EM | Admit: 2020-03-28 | Discharge: 2020-03-28 | Disposition: A | Discharge status: Home / Self Care | Payer: BC Managed Care – PPO | Source: Home / Self Care

## 2020-03-28 DIAGNOSIS — M549 Dorsalgia, unspecified: Secondary | ICD-10-CM

## 2020-03-28 DIAGNOSIS — K209 Esophagitis, unspecified without bleeding: Secondary | ICD-10-CM | POA: Diagnosis present

## 2020-03-28 DIAGNOSIS — R0602 Shortness of breath: Secondary | ICD-10-CM

## 2020-03-28 HISTORY — DX: Esophagitis, unspecified without bleeding: K20.90

## 2020-03-28 MED ORDER — PREDNISONE 20 MG PO TABS
ORAL_TABLET | ORAL | 0 refills | Status: DC
Start: 2020-03-28 — End: 2020-04-10

## 2020-03-28 NOTE — ED Provider Notes (Signed)
[name]    CSN: [id number] Arrival date & time: 03/28/20  1134    History   Chief Complaint Chief Complaint  Patient presents with  . Back Pain  . Shortness of Breath   HPI Johnny Ramirez is a 49 y.o. male.   HPI  Patient with a 3-4 month history of ongoing symptoms of shortness of breath, stabbing pain in lower neck/ throat area, and upper thoracic spine pain.  Patient has been evaluated for these symptoms by his PCP and pulmonologist. Both provider and specialist  favored acid reflux as the source of symptoms. He was prescribed Protonix which he stopped taking several days ago. Endorses daily use of omeprazole. He also has a long history of acid reflux although denies a prior EGD or colonoscopy. He continues to experience same symptoms. He has trailed and  failed albuterol. Prescribed CPAP unable to use due to inability to breath with mask on face.   History reviewed. No pertinent past medical history.  Patient Active Problem List   Diagnosis Date Noted  . Acute esophagitis 03/28/2020  . Upper airway cough syndrome 02/21/2020  . OSA (obstructive sleep apnea) 11/27/2018  . Varicocele 06/10/2014    Past Surgical History:  Procedure Laterality Date  . APPENDECTOMY     as a child  . HERNIA REPAIR     as a child  . INGUINAL HERNIA REPAIR  2011       Home Medications    Prior to Admission medications   Medication Sig Start Date End Date Taking? Authorizing Provider  pantoprazole (PROTONIX) 40 MG tablet Take 30- 60 min before your first and last meals of the day 02/21/20  Yes Tanda Rockers, MD  AMBULATORY NON FORMULARY MEDICATION Medication Name: CPAP set to 9 cm water pressure with humidifier and supplies. Please fit for mask.  Dx OSA. 11/27/18   Hali Marry, MD  Multiple Vitamins-Minerals (MENS MULTI VITAMIN & MINERAL PO) Take 1 tablet by mouth daily.    [provider]  predniSONE (DELTASONE) 20 MG tablet Take 3 PO QAM x 3 days then 1  PO QAM x3days 03/28/20   Scot Jun, FNP    Family History Family History  Problem Relation Age of Onset  . Diabetes Other   . Cancer Father 26    Social History Social History   Tobacco Use  . Smoking status: Never Smoker  . Smokeless tobacco: Never Used  Vaping Use  . Vaping Use: Never used  Substance Use Topics  . Alcohol use: Yes    Alcohol/week: 8.0 standard drinks    Types: 8 Standard drinks or equivalent per week    Comment: weekly/ weekends  . Drug use: No     Allergies   Patient has no known allergies.   Review of Systems Review of Systems Pertinent negatives listed in HPIs  Physical Exam Triage Vital Signs ED Triage Vitals  Enc Vitals Group     BP 03/28/20 1146 131/81     Pulse Rate 03/28/20 1146 91     Resp 03/28/20 1146 16     Temp 03/28/20 1146 98.9 F (37.2 C)     Temp Source 03/28/20 1146 Oral     SpO2 03/28/20 1146 99 %     Weight --      Height --      Head Circumference --      Peak Flow --      Pain Score 03/28/20 1145 2  Pain Loc --      Pain Edu? --      Excl. in [place]? --    No data found.  Updated Vital Signs BP 131/81 (BP Location: Right Arm)   Pulse 91   Temp 98.9 F (37.2 C) (Oral)   Resp 16   SpO2 99%   Visual Acuity Right Eye Distance:   Left Eye Distance:   Bilateral Distance:    Right Eye Near:   Left Eye Near:    Bilateral Near:     Physical Exam General appearance: alert, well developed, well nourished, cooperative and in no distress Head: Normocephalic, without obvious abnormality, atraumatic Respiratory: Respirations even and unlabored, normal respiratory rate Heart: rate and rhythm normal. No gallop or murmurs noted on exam  Abdomen: BS +, no distention, no rebound tenderness, or no mass Extremities: No gross deformities Skin: Skin color, texture, turgor normal. No rashes seen  Psych:wod and affect.   UC Treatments / Results  Labs (all labs ordered are listed, but only abnormal results are  displayed) Labs Reviewed - No data to display  EKG   Radiology No results found.  Procedures Procedures (including critical care time)  Medications Ordered in UC Medications - No data to display  Initial Impression / Assessment and Plan / UC Course  I have reviewed the triage vital signs and the nursing notes.  Pertinent labs & imaging results that were available during my care of the patient were reviewed by me and considered in my medical decision making (see chart for details).    Patient presents today with chronic non-emergent  Symptoms thought to be related to acid reflux which patient endorses as worsening.  Given history and symptoms, suspect symptoms are related recurrent esophagitis. He is scheduled to follow-up with ENT in one week. He is without distress and exam findings are non-emergent today. He achieved relief of symptoms with prednisone previously (prednisone)resolved. Trial a trial  a short course of prednisone. Continue PPI. Red flags discussed.  Recommended discussing EGD with ENT and possibly a referral gastroenterology. An After Visit Summary was printed and given to the patient/family. Precautions discussed. Red flags discussed. Questions invited and answered. They voiced understanding and agreement.   Final Clinical Impressions(s) / UC Diagnoses   Final diagnoses:  Shortness of breath  Acute esophagitis  Upper back pain     Discharge Instructions     Discussed with your ENT specialist the possibility obtaining a EGD or an upper endoscopy study to rule out any narrowing of your esophagus as your symptoms sounds very suspicious for what we call esophagitis. Given that she did find some relief with previously prescribed prednisone taper I will prescribe you another course of the prednisone. Continue to take your omeprazole while taking the prednisone.  Follow-up as needed with PCP and specialist.    ED Prescriptions    Medication Sig Dispense Auth.  Provider   predniSONE (DELTASONE) 20 MG tablet Take 3 PO QAM x 3 days then 1 PO QAM x3days 12 tablet Scot Jun, FNP     PDMP not reviewed this encounter.   Scot Jun, FNP 03/30/20 1708

## 2020-03-28 NOTE — ED Triage Notes (Signed)
Patient presents to Urgent Care with complaints of centralized chest/back pain with deep inspiration since the beginning of April. Patient reports he has seen his PCP and some specialists for it but they all think it is acid reflux, he's on omeprazole for it and it does not help. Has also seen a pulmonologist.

## 2020-03-28 NOTE — Discharge Instructions (Signed)
Discussed with your ENT specialist the possibility obtaining a EGD or an upper endoscopy study to rule out any narrowing of your esophagus as your symptoms sounds very suspicious for what we call esophagitis. Given that she did find some relief with previously prescribed prednisone taper I will prescribe you another course of the prednisone. Continue to take your omeprazole while taking the prednisone.  Follow-up as needed with PCP and specialist.

## 2020-04-03 ENCOUNTER — Emergency Department
Admission: EM | Admit: 2020-04-03 | Discharge: 2020-04-03 | Disposition: A | Discharge status: Home / Self Care | Payer: BC Managed Care – PPO | Source: Home / Self Care

## 2020-04-03 ENCOUNTER — Other Ambulatory Visit: Payer: Self-pay

## 2020-04-03 DIAGNOSIS — N342 Other urethritis: Secondary | ICD-10-CM

## 2020-04-03 NOTE — ED Provider Notes (Signed)
[facility]    CSN: [id number] Arrival date & time: 04/03/20  1646      History   Chief Complaint Chief Complaint  Patient presents with  . Penis Pain    HPI Johnny Ramirez is a 49 y.o. male.   HPI Johnny Ramirez is a 49 y.o. male presenting to UC with c/o 5 days of pain and irritation at the tip of his penis. Pt states "it feels like there is sand in my penis, but it feels better when I pee."  Pt has had unprotected intercourse recently but no known exposure to STIs. Denies fever, chills, n/v/d. Denies abdominal pain or back pain. He has not tried anything for his symptoms. Denies rashes or lesions.    History reviewed. No pertinent past medical history.  Patient Active Problem List   Diagnosis Date Noted  . Acute esophagitis 03/28/2020  . Upper airway cough syndrome 02/21/2020  . OSA (obstructive sleep apnea) 11/27/2018  . Varicocele 06/10/2014    Past Surgical History:  Procedure Laterality Date  . APPENDECTOMY     as a child  . HERNIA REPAIR     as a child  . INGUINAL HERNIA REPAIR  2011       Home Medications    Prior to Admission medications   Medication Sig Start Date End Date Taking? Authorizing Provider  AMBULATORY NON FORMULARY MEDICATION Medication Name: CPAP set to 9 cm water pressure with humidifier and supplies. Please fit for mask.  Dx OSA. 11/27/18   Hali Marry, MD  Multiple Vitamins-Minerals (MENS MULTI VITAMIN & MINERAL PO) Take 1 tablet by mouth daily.    [provider]  pantoprazole (PROTONIX) 40 MG tablet Take 30- 60 min before your first and last meals of the day 02/21/20   Tanda Rockers, MD  predniSONE (DELTASONE) 20 MG tablet Take 3 PO QAM x 3 days then 1 PO QAM x3days 03/28/20   Scot Jun, FNP    Family History Family History  Problem Relation Age of Onset  . Diabetes Other   . Cancer Father 15    Social History Social History   Tobacco Use  . Smoking status: Never Smoker  . Smokeless  tobacco: Never Used  Vaping Use  . Vaping Use: Never used  Substance Use Topics  . Alcohol use: Yes    Alcohol/week: 8.0 standard drinks    Types: 8 Standard drinks or equivalent per week    Comment: weekly/ weekends  . Drug use: No     Allergies   Patient has no known allergies.   Review of Systems Review of Systems  Gastrointestinal: Negative for abdominal pain, diarrhea, nausea and vomiting.  Genitourinary: Positive for penile pain. Negative for decreased urine volume, discharge, dysuria, frequency, genital sores, hematuria, penile swelling, scrotal swelling, testicular pain and urgency.  Skin: Negative for rash.     Physical Exam Triage Vital Signs ED Triage Vitals  Enc Vitals Group     BP 04/03/20 1658 (!) 135/81     Pulse Rate 04/03/20 1658 104     Resp 04/03/20 1658 18     Temp 04/03/20 1658 98.6 F (37 C)     Temp Source 04/03/20 1658 Oral     SpO2 04/03/20 1658 97 %     Weight --      Height --      Head Circumference --      Peak Flow --      Pain Score 04/03/20 1657  0     Pain Loc --      Pain Edu? --      Excl. in [place]? --    No data found.  Updated Vital Signs BP (!) 135/81 (BP Location: Right Arm)   Pulse 104   Temp 98.6 F (37 C) (Oral)   Resp 18   SpO2 97%   Visual Acuity Right Eye Distance:   Left Eye Distance:   Bilateral Distance:    Right Eye Near:   Left Eye Near:    Bilateral Near:     Physical Exam Vitals and nursing note reviewed. Exam conducted with a chaperone present.  Constitutional:      Appearance: Normal appearance. He is well-developed.  HENT:     Head: Normocephalic and atraumatic.  Cardiovascular:     Rate and Rhythm: Normal rate.  Pulmonary:     Effort: Pulmonary effort is normal. No respiratory distress.  Genitourinary:    Penis: Normal and circumcised. No phimosis, paraphimosis, erythema, tenderness, discharge, swelling or lesions.      Testes: Normal.  Musculoskeletal:        General: Normal range of  motion.     Cervical back: Normal range of motion.  Skin:    General: Skin is warm and dry.  Neurological:     Mental Status: He is alert and oriented to person, place, and time.  Psychiatric:        Behavior: Behavior normal.      UC Treatments / Results  Labs (all labs ordered are listed, but only abnormal results are displayed) Labs Reviewed  C. TRACHOMATIS/N. GONORRHOEAE RNA    EKG   Radiology No results found.  Procedures Procedures (including critical care time)  Medications Ordered in UC Medications - No data to display  Initial Impression / Assessment and Plan / UC Course  I have reviewed the triage vital signs and the nursing notes.  Pertinent labs & imaging results that were available during my care of the patient were reviewed by me and considered in my medical decision making (see chart for details).     Normal exam. Urine for GC/chlamydia- pending Encouraged to keep urine diluted by staying well hydrated while waiting on labs. F/u with PCP and/or urology as needed. AVS provided.  Final Clinical Impressions(s) / UC Diagnoses   Final diagnoses:  Urethritis     Discharge Instructions      Your test results should come back within 2-3 days. If treatment is indicated, you will be notified.   Call to schedule a follow up exam with primary care or a urologist next week if not improving.    ED Prescriptions    None     PDMP not reviewed this encounter.   Noe Gens, [place] 04/03/20 1850

## 2020-04-03 NOTE — Discharge Instructions (Signed)
  Your test results should come back within 2-3 days. If treatment is indicated, you will be notified.   Call to schedule a follow up exam with primary care or a urologist next week if not improving.

## 2020-04-03 NOTE — ED Triage Notes (Signed)
Patient presents to Urgent Care with complaints of penis pain since five days ago. Patient reports it "feels like there is sand in my penis, but it feels better when I pee". Pt endorses unprotected intercourse, denies burning with urination or frequency.

## 2020-04-04 LAB — C. TRACHOMATIS/N. GONORRHOEAE RNA
C. trachomatis RNA, TMA: NOT DETECTED
N. gonorrhoeae RNA, TMA: NOT DETECTED

## 2020-04-10 ENCOUNTER — Encounter: Payer: Self-pay | Admitting: Family Medicine

## 2020-04-10 ENCOUNTER — Ambulatory Visit (INDEPENDENT_AMBULATORY_CARE_PROVIDER_SITE_OTHER): Payer: BC Managed Care – PPO | Admitting: Family Medicine

## 2020-04-10 ENCOUNTER — Other Ambulatory Visit: Payer: Self-pay

## 2020-04-10 DIAGNOSIS — N341 Nonspecific urethritis: Secondary | ICD-10-CM

## 2020-04-10 HISTORY — DX: Nonspecific urethritis: N34.1

## 2020-04-10 MED ORDER — DOXYCYCLINE HYCLATE 100 MG PO TABS
100.0000 mg | ORAL_TABLET | Freq: Two times a day (BID) | ORAL | 0 refills | Status: AC
Start: 2020-04-10 — End: 2020-04-17

## 2020-04-10 NOTE — Patient Instructions (Signed)
Stop the amoxicillin. The doxycycline should help with sinus infection as well.    Urethritis, Adult  Urethritis is swelling (inflammation) of the urethra. The urethra is the tube that drains urine from the bladder. It is important to get treatment for this condition early. Delayed treatment may lead to complications. What are the causes? This condition may be caused by:  Germs that are spread through sexual contact. This is the leading cause of urethritis. This may include bacterial or viral infections.  Injury to the urethra. Injury can happen after a thin, flexible tube (catheter) is inserted into the urethra to drain urine, or after medical instruments or foreign bodies are inserted into the area.  Chemical irritation. This may include contact with spermicide.  A disease that causes inflammation. This is rare. What increases the risk? The following factors may make you more likely to develop this condition:  Having sex without using a condom.  Having multiple sexual partners.  Having poor hygiene. What are the signs or symptoms? Symptoms of this condition include:  Pain with urination.  Frequent urination.  Urgent need to urinate.  Itching and pain in the vagina or penis.  Discharge coming from the penis. However, women rarely have symptoms. How is this diagnosed? This condition is diagnosed based on your medical history and symptoms as well as a physical exam. Tests may also be done. These may include:  Urine tests.  Swabs from the urethra. How is this treated? Treatment for this condition depends on the cause. Urethritis caused by a bacterial infection is treated with antibiotic medicine. Any sexual partners must also be treated. Follow these instructions at home: Medicines  Take over-the-counter and prescription medicines only as told by your health care provider.  If you were prescribed antibiotic medicine, take it as told by your health care provider. Do  not stop taking the antibiotic even if you start to feel better. Lifestyle  Avoid using perfumed soaps, bubble bath, and shampoo when you bathe or shower. Rinse the vaginal area after bathing.  Wear cotton underwear. Not wearing underwear when going to bed can help.  Make sure to wipe from front to back after using the toilet if you are male.  Do not have sex until your health care provider approves. When you do have sex, be sure to practice safe sex.  Tell anyone with whom you have had sexual relations in the past 60 days that he or she may be at risk of infection. General instructions  Drink enough fluid to keep your urine clear or pale yellow.  It is up to you to get your test results. Ask your health care provider, or the department that is doing the test, when your results will be ready.  Keep all follow-up visits as told by your health care provider. This is important.  Get tested again 3 months after treatment to make sure the infection is gone. It is important that your sexual partner also gets tested again. Contact a health care provider if:  Your symptoms have not improved after 3 days.  Your symptoms get worse.  You have eye redness or pain.  You develop abdominal pain or pelvic pain (in females).  You develop joint pain.  You have a fever. Get help right away if:  You have severe pain in the belly, back, or side.  You vomit repeatedly. Summary  Urethritis is a swelling (inflammation) of the urethra.  This condition is caused by germs that are spread through sexual  contact. This is the main cause of this illness.  It is important to get treatment for this condition early. Delayed treatment may lead to complications.  Treatment for this condition depends on the cause. Any sexual partners must also be treated. This information is not intended to replace advice given to you by your health care provider. Make sure you discuss any questions you have with your  health care provider. Document Revised: 08/12/2017 Document Reviewed: 10/05/2016 Elsevier Patient Education  2020 [employer].

## 2020-04-10 NOTE — Progress Notes (Signed)
Johnny Ramirez - 49 y.o. male MRN [id number]  Date of birth: 1970/11/23  Subjective Chief Complaint  Patient presents with  . painful urination  . penile irritation    HPI Johnny Ramirez is a 49 y.o. male here today with complaint of painful urination and urethral irritation.  Reports that symptoms started about 10 days ago.  Describes as irritation at the tip of the penis at the urethral meatus as well as irritation inside the urethra.  Voiding does provide some relief for him temporarily.  He was seen at urgent care on 7/22 for this as well and GC/Chlamydia sent which returned negative.  He denies associated fever, chills, testicular pain, or groin/perineal pain.  He was seen by ENT recently as well and started on augmentin for sinus infection.  His urinary symptoms did improve for a couple of days after starting antibiotic but returned.    ROS:  A comprehensive ROS was completed and negative except as noted per HPI  No Known Allergies  History reviewed. No pertinent past medical history.  Past Surgical History:  Procedure Laterality Date  . APPENDECTOMY     as a child  . HERNIA REPAIR     as a child  . INGUINAL HERNIA REPAIR  2011    Social History   Socioeconomic History  . Marital status: Single    Spouse name: Not on file  . Number of children: Not on file  . Years of education: Not on file  . Highest education level: Not on file  Occupational History  . Not on file  Tobacco Use  . Smoking status: Never Smoker  . Smokeless tobacco: Never Used  Vaping Use  . Vaping Use: Never used  Substance and Sexual Activity  . Alcohol use: Yes    Alcohol/week: 8.0 standard drinks    Types: 8 Standard drinks or equivalent per week    Comment: weekly/ weekends  . Drug use: No  . Sexual activity: Not on file  Other Topics Concern  . Not on file  Social History Narrative   No regular exercise.        Social Determinants of Health   Financial Resource Strain:   .  Difficulty of Paying Living Expenses:   Food Insecurity:   . Worried About Charity fundraiser in the Last Year:   . Arboriculturist in the Last Year:   Transportation Needs:   . Film/video editor (Medical):   Marland Kitchen Lack of Transportation (Non-Medical):   Physical Activity:   . Days of Exercise per Week:   . Minutes of Exercise per Session:   Stress:   . Feeling of Stress :   Social Connections:   . Frequency of Communication with Friends and Family:   . Frequency of Social Gatherings with Friends and Family:   . Attends Religious Services:   . Active Member of Clubs or Organizations:   . Attends Archivist Meetings:   Marland Kitchen Marital Status:     Family History  Problem Relation Age of Onset  . Diabetes Other   . Cancer Father 61    Health Maintenance  Topic Date Due  . Hepatitis C Screening  Never done  . COVID-19 Vaccine (1) 01/23/2021 (Originally 09/23/1983)  . HIV Screening  07/26/2024 (Originally 09/22/1986)  . INFLUENZA VACCINE  04/13/2020  . TETANUS/TDAP  08/21/2029     ----------------------------------------------------------------------------------------------------------------------------------------------------------------------------------------------------------------- Physical Exam BP 119/82 (BP Location: Left Arm, Patient Position: Sitting, Cuff Size: Large)   Pulse  78   Ht 6' 0.05" (1.83 m)   Wt (!) 238 lb 8 oz (108.2 kg)   SpO2 100%   BMI 32.30 kg/m   Physical Exam Constitutional:      Appearance: Normal appearance.  Genitourinary:    Penis: Normal and circumcised.      Testes: Normal.        Right: Mass, tenderness or swelling not present.        Left: Mass, tenderness or swelling not present.     Epididymis:     Right: Normal. Not inflamed.     Left: Normal. Not inflamed.     Comments: No ulceration or lesions noted.  Neurological:     Mental Status: He is alert.      ------------------------------------------------------------------------------------------------------------------------------------------------------------------------------------------------------------------- Assessment and Plan  Non-gonococcal urethritis GC/Chlamydia returned negative.   Start doxycycline 100mg  BID x7 days.  I discussed with him that if symptoms are not improving with this that I would recommend referral to urology.     Meds ordered this encounter  Medications  . doxycycline (VIBRA-TABS) 100 MG tablet    Sig: Take 1 tablet (100 mg total) by mouth 2 (two) times daily for 7 days.    Dispense:  14 tablet    Refill:  0    No follow-ups on file.    This visit occurred during the SARS-CoV-2 public health emergency.  Safety protocols were in place, including screening questions prior to the visit, additional usage of staff PPE, and extensive cleaning of exam room while observing appropriate contact time as indicated for disinfecting solutions.

## 2020-04-10 NOTE — Assessment & Plan Note (Signed)
GC/Chlamydia returned negative.   Start doxycycline 100mg  BID x7 days.  I discussed with him that if symptoms are not improving with this that I would recommend referral to urology.

## 2020-04-15 ENCOUNTER — Encounter: Payer: Self-pay | Admitting: Family Medicine

## 2020-04-15 DIAGNOSIS — N341 Nonspecific urethritis: Secondary | ICD-10-CM

## 2020-04-16 ENCOUNTER — Other Ambulatory Visit: Payer: Self-pay | Admitting: Family Medicine

## 2020-04-16 DIAGNOSIS — N342 Other urethritis: Secondary | ICD-10-CM

## 2020-07-11 ENCOUNTER — Encounter: Payer: Self-pay | Admitting: Nurse Practitioner

## 2020-07-11 ENCOUNTER — Emergency Department (INDEPENDENT_AMBULATORY_CARE_PROVIDER_SITE_OTHER): Payer: BC Managed Care – PPO

## 2020-07-11 ENCOUNTER — Telehealth: Payer: Self-pay | Admitting: Nurse Practitioner

## 2020-07-11 ENCOUNTER — Telehealth (HOSPITAL_COMMUNITY): Payer: Self-pay

## 2020-07-11 ENCOUNTER — Emergency Department (INDEPENDENT_AMBULATORY_CARE_PROVIDER_SITE_OTHER)
Admission: EM | Admit: 2020-07-11 | Discharge: 2020-07-11 | Disposition: A | Discharge status: Home / Self Care | Payer: BC Managed Care – PPO | Source: Home / Self Care | Attending: Family Medicine | Admitting: Family Medicine

## 2020-07-11 ENCOUNTER — Other Ambulatory Visit: Payer: Self-pay

## 2020-07-11 DIAGNOSIS — U071 COVID-19: Secondary | ICD-10-CM | POA: Diagnosis not present

## 2020-07-11 DIAGNOSIS — J1282 Pneumonia due to coronavirus disease 2019: Secondary | ICD-10-CM

## 2020-07-11 LAB — POC SARS CORONAVIRUS 2 AG -  ED: SARS Coronavirus 2 Ag: POSITIVE — AB

## 2020-07-11 MED ORDER — DOXYCYCLINE HYCLATE 100 MG PO CAPS
ORAL_CAPSULE | ORAL | 0 refills | Status: DC
Start: 2020-07-11 — End: 2020-10-28

## 2020-07-11 NOTE — Telephone Encounter (Signed)
Called to Discuss with patient about Covid symptoms and the use of the monoclonal antibody infusion for those with mild to moderate Covid symptoms and at a high risk of hospitalization.     Pt appears to qualify for this infusion due to co-morbid conditions and/or a member of an at-risk group in accordance with the FDA Emergency Use Authorization.    Risk factor: sleep apnea  Sx onset of 07/09/20. Tested positive at [facility] 07/11/20.  Pt pre-screened and ready for APP to call to further discuss/schedule appt for antibody infusion.

## 2020-07-11 NOTE — Telephone Encounter (Signed)
COVID MAB Infusion Contact Note   Qualifiers: BMI >25, OSA Symptoms:  Symptom Onset:   Contacted patient by telephone to discuss MAB infusion as treatment for COVID-19. The patient is interested in the treatment, but would like to check with his insurance company to find out more about their coverage.   Provided patient with CPT code and information for insurance company.  Patient is to respond to MyChart message if he wishes to schedule for the treatment once he has been in touch with his insurance company.   Worthy Keeler, DNP, AGNP-c COVID-19 [facility] [phone]

## 2020-07-11 NOTE — Discharge Instructions (Addendum)
Take plain guaifenesin (1200mg  extended release tabs such as Mucinex) twice daily, with plenty of water, for cough and congestion.    May take Delsym Cough Suppressant ("12 Hour Cough Relief") at bedtime for nighttime cough.  Try warm salt water gargles for sore throat.  Stop all antihistamines for now, and other non-prescription cough/cold preparations.  Your COVID19 test is positive.  You are infected with the novel coronavirus and could give the virus to others.  If you do not have symptoms, please isolate at home for 10 days from the day you were tested. Once you complete your 10 day quarantine, you may return to normal activities as long as you've not had a fever for over 24 hours (without taking fever reducing medicine) and your symptoms are improving. Please continue good preventive care measures, including:  frequent hand-washing, avoid touching your face, cover coughs/sneezes, stay out of crowds and keep a 6 foot distance from others.  Go to the nearest hospital emergency room if fever/cough/breathlessness are severe or illness seems like a threat to life.   Recommend obtaining a pulse oximeter (oxygen meter) and check your oxygen regularly.  Go to an emergency room if oxygen begins decreasing and you develop increasing shortness of breath.

## 2020-07-11 NOTE — ED Triage Notes (Signed)
Pt states he has had cough, congestion, generalized body aches, and headache x 2 days. Pt states he is unvaccinated and has been taking mucinex and ibuprofen otc with good results. Pt is aox4 and ambulatory.

## 2020-07-11 NOTE — ED Provider Notes (Signed)
[name]    CSN: [id number] Arrival date & time: 07/11/20  1019      History   Chief Complaint Chief Complaint  Patient presents with  . Cough    x 2 days  . Generalized Body Aches    x 2 days  . Nasal Congestion    x 2 days  . Headache    x 2 days    HPI Johnny Ramirez is a 49 y.o. male.   Two days ago patient developed myalgias, chills, fatigue, increased sinus congestion, and headache. Yesterday he developed a non-productive cough and decreased appetite.  His ears have felt clogged.   He denies chest tightness, shortness of breath, and changes in taste/smell. He has a history of perennial rhinitis and recurring sinusitis.  The history is provided by the patient.    History reviewed. No pertinent past medical history.  Patient Active Problem List   Diagnosis Date Noted  . 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 Acute esophagitis 03/28/2020  . Upper airway cough syndrome 02/21/2020  . OSA (obstructive sleep apnea) 11/27/2018  . Varicocele 06/10/2014    Past Surgical History:  Procedure Laterality Date  . APPENDECTOMY     as a child  . HERNIA REPAIR     as a child  . INGUINAL HERNIA REPAIR  2011       Home Medications    Prior to Admission medications   Medication Sig Start Date End Date Taking? Authorizing Provider  AMBULATORY NON FORMULARY MEDICATION Medication Name: CPAP set to 9 cm water pressure with humidifier and supplies. Please fit for mask.  Dx OSA. 11/27/18  Yes Hali Marry, MD  Multiple Vitamins-Minerals (MENS MULTI VITAMIN & MINERAL PO) Take 1 tablet by mouth daily.   Yes [provider]  pantoprazole (PROTONIX) 40 MG tablet Take 30- 60 min before your first and last meals of the day 02/21/20  Yes Tanda Rockers, MD  doxycycline (VIBRAMYCIN) 100 MG capsule Take one cap PO Q12hr with food. 07/11/20   Kandra Nicolas, MD    Family History Family History  Problem Relation Age of Onset  . Diabetes Other     . Cancer Father 8    Social History Social History   Tobacco Use  . Smoking status: Never Smoker  . Smokeless tobacco: Never Used  Vaping Use  . Vaping Use: Never used  Substance Use Topics  . Alcohol use: Yes    Alcohol/week: 8.0 standard drinks    Types: 8 Standard drinks or equivalent per week    Comment: weekly/ weekends  . Drug use: No     Allergies   Patient has no known allergies.   Review of Systems Review of Systems + sore throat + cough No pleuritic pain No wheezing + nasal congestion + post-nasal drainage No sinus pain/pressure No itchy/red eyes ? earache No hemoptysis No SOB ? fever, + chills No nausea No vomiting No abdominal pain No diarrhea No urinary symptoms No skin rash + fatigue + myalgias + headache Used OTC meds (Mucinex D and ibuprofen) without relief   Physical Exam Triage Vital Signs ED Triage Vitals  Enc Vitals Group     BP 07/11/20 1033 108/76     Pulse Rate 07/11/20 1033 (!) 103     Resp 07/11/20 1033 18     Temp 07/11/20 1033 (!) 100.4 F (38 C)     Temp Source 07/11/20 1033 Oral     SpO2 07/11/20 1033 94 %  Weight --      Height --      Head Circumference --      Peak Flow --      Pain Score 07/11/20 1036 7     Pain Loc --      Pain Edu? --      Excl. in [place]? --    No data found.  Updated Vital Signs BP 108/76 (BP Location: Left Arm)   Pulse (!) 103   Temp (!) 100.4 F (38 C) (Oral)   Resp 18   Ht 6' (1.829 m)   Wt 105.6 kg   SpO2 94%   BMI 31.57 kg/m   Visual Acuity Right Eye Distance:   Left Eye Distance:   Bilateral Distance:    Right Eye Near:   Left Eye Near:    Bilateral Near:     Physical Exam Nursing notes and Vital Signs reviewed. Appearance:  Patient appears stated age, and in no acute distress Eyes:  Pupils are equal, round, and reactive to light and accomodation.  Extraocular movement is intact.  Conjunctivae are not inflamed  Ears:  Canals normal.  Tympanic membranes normal.   Nose:  Mildly congested turbinates.  No sinus tenderness.  Pharynx:  Normal; moist mucous membranes  Neck:  Supple.  No adenopathy. Lungs:  Clear to auscultation.  Breath sounds are equal.  Moving air well. Heart:  Regular rate and rhythm without murmurs, rubs, or gallops.  Abdomen:  Nontender without masses or hepatosplenomegaly.  Bowel sounds are present.  No CVA or flank tenderness.  Extremities:  No edema.  Skin:  No rash present.   UC Treatments / Results  Labs (all labs ordered are listed, but only abnormal results are displayed) Labs Reviewed  POC SARS CORONAVIRUS 2 AG -  ED - Abnormal; Notable for the following components:      Result Value   SARS Coronavirus 2 Ag Positive (*)    All other components within normal limits    EKG   Radiology EXAM: CHEST - 2 VIEW  COMPARISON:  Jan 15, 2020  FINDINGS: There is a subtle area of ill-defined opacity in the periphery of the right mid lung. Lungs elsewhere are clear. Heart size and pulmonary vascularity are normal. No adenopathy. No bone lesions.  IMPRESSION: Subtle area of opacity in the periphery of the right mid lung, an apparent small focus of pneumonia. Lungs elsewhere clear. Heart size normal. No adenopathy.  These results will be called to the ordering clinician or representative by the Radiologist Assistant, and communication documented in the PACS or [employer].   Electronically Signed   By: Lowella Grip III M.D.   On: 07/11/2020 11:58  Procedures Procedures (including critical care time)  Medications Ordered in UC Medications - No data to display  Initial Impression / Assessment and Plan / UC Course  I have reviewed the triage vital signs and the nursing notes.  Pertinent labs & imaging results that were available during my care of the patient were reviewed by me and considered in my medical decision making (see chart for details).    Will begin empiric doxycycline 100mg   BID. Patient qualifies for monoclonal antibody treatment based on his BMI and history of OSA. Patient referred to [place].   Final Clinical Impressions(s) / UC Diagnoses   Final diagnoses:  COVID-19 virus infection  Pneumonia due to COVID-19 virus    ED Prescriptions    Medication Sig Dispense Auth. Provider  doxycycline (VIBRAMYCIN) 100 MG capsule Take one cap PO Q12hr with food. 20 capsule Kandra Nicolas, MD    Take plain guaifenesin (1200mg  extended release tabs such as Mucinex) twice daily, with plenty of water, for cough and congestion.    May take Delsym Cough Suppressant ("12 Hour Cough Relief") at bedtime for nighttime cough.  Try warm salt water gargles for sore throat.  Stop all antihistamines for now, and other non-prescription cough/cold preparations.  Your COVID19 test is positive.  You are infected with the novel coronavirus and could give the virus to others.  If you do not have symptoms, please isolate at home for 10 days from the day you were tested. Once you complete your 10 day quarantine, you may return to normal activities as long as you've not had a fever for over 24 hours (without taking fever reducing medicine) and your symptoms are improving. Please continue good preventive care measures, including:  frequent hand-washing, avoid touching your face, cover coughs/sneezes, stay out of crowds and keep a 6 foot distance from others.  Go to the nearest hospital emergency room if fever/cough/breathlessness are severe or illness seems like a threat to life.   Recommend obtaining a pulse oximeter (oxygen meter) and check your oxygen regularly.  Go to an emergency room if oxygen begins decreasing and you develop increasing shortness of breath.     Kandra Nicolas, MD 07/14/20 [phone]

## 2020-10-28 ENCOUNTER — Emergency Department
Admission: EM | Admit: 2020-10-28 | Discharge: 2020-10-28 | Disposition: A | Discharge status: Home / Self Care | Payer: BC Managed Care – PPO | Source: Home / Self Care | Attending: Family Medicine | Admitting: Family Medicine

## 2020-10-28 ENCOUNTER — Emergency Department (INDEPENDENT_AMBULATORY_CARE_PROVIDER_SITE_OTHER): Payer: BC Managed Care – PPO

## 2020-10-28 ENCOUNTER — Other Ambulatory Visit: Payer: Self-pay

## 2020-10-28 ENCOUNTER — Ambulatory Visit (INDEPENDENT_AMBULATORY_CARE_PROVIDER_SITE_OTHER): Payer: BC Managed Care – PPO

## 2020-10-28 ENCOUNTER — Ambulatory Visit: Payer: BC Managed Care – PPO | Admitting: Family Medicine

## 2020-10-28 ENCOUNTER — Encounter: Payer: Self-pay | Admitting: Emergency Medicine

## 2020-10-28 ENCOUNTER — Encounter: Payer: Self-pay | Admitting: Family Medicine

## 2020-10-28 VITALS — BP 125/79 | HR 87 | Ht 72.0 in | Wt 234.0 lb

## 2020-10-28 DIAGNOSIS — R5383 Other fatigue: Secondary | ICD-10-CM

## 2020-10-28 DIAGNOSIS — R079 Chest pain, unspecified: Secondary | ICD-10-CM | POA: Diagnosis not present

## 2020-10-28 DIAGNOSIS — R0789 Other chest pain: Secondary | ICD-10-CM | POA: Diagnosis not present

## 2020-10-28 DIAGNOSIS — R0602 Shortness of breath: Secondary | ICD-10-CM | POA: Diagnosis not present

## 2020-10-28 DIAGNOSIS — R000 Tachycardia, unspecified: Secondary | ICD-10-CM

## 2020-10-28 DIAGNOSIS — R6883 Chills (without fever): Secondary | ICD-10-CM

## 2020-10-28 DIAGNOSIS — R002 Palpitations: Secondary | ICD-10-CM

## 2020-10-28 HISTORY — DX: Tachycardia, unspecified: R00.0

## 2020-10-28 NOTE — ED Triage Notes (Signed)
2 weeks of chest tightness w/ SOB  Denies currently- no SOB States his HR goes up - no monitor  Pt had increased HR last night - kept pt awake Described as palpitations Family history of "all the men in my family have had stents placed in their heart" COVID in October 2021- no infusion/no vaccine

## 2020-10-28 NOTE — Progress Notes (Signed)
Established Patient Office Visit  Subjective:  Patient ID: Johnny Ramirez, male    DOB: Feb 02, 1971  Age: 50 y.o. MRN: [id number]  CC:  Chief Complaint  Patient presents with  . Follow-up    HPI Johnny Ramirez presents for racing heart x 1 month.  He just notices light activity such as getting up and walking across the shop at work or sometimes even just rolling over in bed at night that his heart rate will pick up and go little faster 100 after a minute or so it tends to go back down.  He said he had never noticed this before until about a month ago.  He says a few times it is actually woken him up at night with his heart racing including last night.  He does notice that he gets more easily tired and short of breath with activities.  He has also been hearing his heart beat in his ears and chest on and off,.  He notices the heartbeat in his ears bilaterally occasionally at night rarely during the day.  He went to UC today.  He had a normal chest xray and they did labs.    Times he has felt a very forceful repetitive beat for maybe up to 5 beats in the night, eases off he just had never noticed that before he does drink about 1 cup of coffee per day and usually water the rest of the day.  He is not taking any decongestants, energy drinks etc.  He is not noticed any extremity swelling.  He does take a multivitamin daily.  He also reports that he occasionally notices some chest tightness that usually with more intense activity.  He did have Covid back in October.  But did not notice the symptoms until this January.  No past medical history on file.  Past Surgical History:  Procedure Laterality Date  . APPENDECTOMY     as a child  . HERNIA REPAIR     as a child  . INGUINAL HERNIA REPAIR  2011    Family History  Problem Relation Age of Onset  . Diabetes Other   . Cancer Father 39    Social History   Socioeconomic History  . Marital status: Single    Spouse name: Not on file  .  Number of children: Not on file  . Years of education: Not on file  . Highest education level: Not on file  Occupational History  . Not on file  Tobacco Use  . Smoking status: Never Smoker  . Smokeless tobacco: Never Used  Vaping Use  . Vaping Use: Never used  Substance and Sexual Activity  . Alcohol use: Yes    Alcohol/week: 8.0 standard drinks    Types: 8 Standard drinks or equivalent per week    Comment: weekly/ weekends  . Drug use: No  . Sexual activity: Yes  Other Topics Concern  . Not on file  Social History Narrative   No regular exercise.        Social Determinants of Health   Financial Resource Strain: Not on file  Food Insecurity: Not on file  Transportation Needs: Not on file  Physical Activity: Not on file  Stress: Not on file  Social Connections: Not on file  Intimate Partner Violence: Not on file    Outpatient Medications Prior to Visit  Medication Sig Dispense Refill  . AMBULATORY NON FORMULARY MEDICATION Medication Name: CPAP set to 9 cm water pressure with humidifier and supplies.  Please fit for mask.  Dx OSA. 1 vial 0  . Multiple Vitamins-Minerals (MENS MULTI VITAMIN & MINERAL PO) Take 1 tablet by mouth daily.    . pantoprazole (PROTONIX) 40 MG tablet Take 30- 60 min before your first and last meals of the day 60 tablet 2  . doxycycline (VIBRAMYCIN) 100 MG capsule Take one cap PO Q12hr with food. (Patient not taking: Reported on 10/28/2020) 20 capsule 0   No facility-administered medications prior to visit.    No Known Allergies  ROS Review of Systems    Objective:    Physical Exam Constitutional:      Appearance: He is well-developed and well-nourished.  HENT:     Head: Normocephalic and atraumatic.     Right Ear: External ear normal.     Left Ear: External ear normal.  Eyes:     Conjunctiva/sclera: Conjunctivae normal.  Cardiovascular:     Rate and Rhythm: Normal rate and regular rhythm.     Heart sounds: Normal heart sounds.   Pulmonary:     Effort: Pulmonary effort is normal.     Breath sounds: Normal breath sounds.  Abdominal:     General: Abdomen is flat.  Musculoskeletal:        General: No swelling.     Cervical back: Neck supple.  Skin:    General: Skin is warm and dry.  Neurological:     Mental Status: He is alert and oriented to person, place, and time.  Psychiatric:        Mood and Affect: Mood and affect normal.        Behavior: Behavior normal.     BP 125/79   Pulse 87   Ht 6' (1.829 m)   Wt 234 lb (106.1 kg)   SpO2 100%   BMI 31.74 kg/m  Wt Readings from Last 3 Encounters:  10/28/20 234 lb (106.1 kg)  10/28/20 235 lb (106.6 kg)  07/11/20 232 lb 12.8 oz (105.6 kg)     Health Maintenance Due  Topic Date Due  . Hepatitis C Screening  Never done  . COLONOSCOPY (Pts 45-33yrs Insurance coverage will need to be confirmed)  Never done  . INFLUENZA VACCINE  04/13/2020    There are no preventive care reminders to display for this patient.  Lab Results  Component Value Date   TSH 1.39 07/27/2019   Lab Results  Component Value Date   WBC 5.2 10/28/2020   HGB 15.1 10/28/2020   HCT 44.1 10/28/2020   MCV 92.5 10/28/2020   PLT 264 10/28/2020   Lab Results  Component Value Date   NA 141 07/27/2019   K 4.0 07/27/2019   CO2 22 07/27/2019   GLUCOSE 115 (H) 07/27/2019   BUN 10 07/27/2019   CREATININE 1.12 07/27/2019   BILITOT 0.6 07/27/2019   ALKPHOS 57 11/04/2016   AST 22 07/27/2019   ALT 33 07/27/2019   PROT 7.2 07/27/2019   ALBUMIN 4.5 11/04/2016   CALCIUM 9.8 07/27/2019   Lab Results  Component Value Date   CHOL 160 09/22/2018   Lab Results  Component Value Date   HDL 47 09/22/2018   Lab Results  Component Value Date   LDLCALC 99 09/22/2018   Lab Results  Component Value Date   TRIG 58 09/22/2018   Lab Results  Component Value Date   CHOLHDL 3.4 09/22/2018   Lab Results  Component Value Date   HGBA1C 5.6 09/22/2018      Assessment & Plan:  Problem List Items Addressed This Visit      Other   Tachycardia - Primary   Relevant Orders   TSH   ECHOCARDIOGRAM COMPLETE   LONG TERM MONITOR (3-14 DAYS)   B Nat Peptide   Atypical chest pain   Relevant Orders   TSH   ECHOCARDIOGRAM COMPLETE   LONG TERM MONITOR (3-14 DAYS)   B Nat Peptide     Tachycardia-sounds like he is getting an abnormal tachycardic response to just mild to moderate activity.  We will schedule Zio patch to monitor and evaluate for possible arrhythmia.  Consider that this could be secondary to Covid though it is a little unusual that worsening in several months after the active infection but certainly within the realm of possibility.  We have seen this in some patients.  I do not highly suspect deconditioning he is still in pretty good physical health in general.  But certainly that is a consideration.  Would like to check a thyroid level as well he just had a CBC and a CMP performed with urgent care so we will await those results as well just to rule out any other abnormalities or electrolyte abnormalities.  Atypical chest pain-he does get a tightness in his chest with more intense activity.  We will move forward with echocardiogram and possible treadmill stress test.  Again I want a get the Zio patch back and the echo results back first.  No known history of coronary artery disease.  He is overall low risk with no history of hypertension, non-smoker, no significant hyperlipidemia.     No orders of the defined types were placed in this encounter.   Follow-up: Return if symptoms worsen or fail to improve.    Beatrice Lecher, MD

## 2020-10-28 NOTE — Discharge Instructions (Addendum)
Minimize activities that cause chest pain or palpitations.  If symptoms become significantly worse during the night or over the weekend, proceed to the local emergency room.

## 2020-10-28 NOTE — ED Provider Notes (Signed)
[name]    CSN: [id number] Arrival date & time: 10/28/20  0805      History   Chief Complaint Chief Complaint  Patient presents with  . Shortness of Breath  . Palpitations    HPI Johnny Ramirez is a 50 y.o. male.   Patient complains of recurring palpitations both with activity and at rest for about 2 weeks, associated with tightness in his anterior chest.  Last night he was awakened at 1:30am with symptoms that resolved spontaneously, but he was unable to go back to sleep.  He denies shortness of breath, cough, and pleuritic pain.  He states that he has had chills for about 3 weeks.  He denies GU and GI symptoms. Family history of ASCVD:  Father and 2 paternal uncles had cardiac stents placed.  Paternal grandfather had "heart problems." Patient recovered from [place] pneumonia in October 2021.  The history is provided by the patient.  Palpitations Palpitations quality:  Regular Timing:  Intermittent Progression:  Worsening Chronicity:  New Context: exercise   Context: not anxiety, not bronchodilators, not caffeine, not dehydration, not hyperventilation, not illicit drugs, not nicotine and not stimulant use   Relieved by:  Nothing Exacerbated by: activity. Ineffective treatments:  None tried Associated symptoms: chest pain and malaise/fatigue   Associated symptoms: no back pain, no cough, no diaphoresis, no dizziness, no hemoptysis, no leg pain, no lower extremity edema, no nausea, no near-syncope, no numbness, no orthopnea, no PND, no shortness of breath, no syncope, no vomiting and no weakness   Risk factors: no diabetes mellitus, no hx of atrial fibrillation, no hx of DVT, no hx of PE and no hx of thyroid disease     History reviewed. No pertinent past medical history.  Patient Active Problem List   Diagnosis Date Noted  . 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 Acute esophagitis 03/28/2020  . Upper airway cough syndrome 02/21/2020  . OSA (obstructive  sleep apnea) 11/27/2018  . Varicocele 06/10/2014    Past Surgical History:  Procedure Laterality Date  . APPENDECTOMY     as a child  . HERNIA REPAIR     as a child  . INGUINAL HERNIA REPAIR  2011       Home Medications    Prior to Admission medications   Medication Sig Start Date End Date Taking? Authorizing Provider  AMBULATORY NON FORMULARY MEDICATION Medication Name: CPAP set to 9 cm water pressure with humidifier and supplies. Please fit for mask.  Dx OSA. 11/27/18  Yes Hali Marry, MD  Multiple Vitamins-Minerals (MENS MULTI VITAMIN & MINERAL PO) Take 1 tablet by mouth daily.   Yes [provider]  pantoprazole (PROTONIX) 40 MG tablet Take 30- 60 min before your first and last meals of the day 02/21/20  Yes Tanda Rockers, MD  doxycycline (VIBRAMYCIN) 100 MG capsule Take one cap PO Q12hr with food. Patient not taking: Reported on 10/28/2020 07/11/20   Kandra Nicolas, MD    Family History Family History  Problem Relation Age of Onset  . Diabetes Other   . Cancer Father 77    Social History Social History   Tobacco Use  . Smoking status: Never Smoker  . Smokeless tobacco: Never Used  Vaping Use  . Vaping Use: Never used  Substance Use Topics  . Alcohol use: Yes    Alcohol/week: 8.0 standard drinks    Types: 8 Standard drinks or equivalent per week    Comment: weekly/ weekends  . Drug use:  No     Allergies   Patient has no known allergies.   Review of Systems Review of Systems  Constitutional: Positive for chills, fatigue and malaise/fatigue. Negative for activity change, appetite change, diaphoresis, fever and unexpected weight change.  HENT: Negative.   Eyes: Negative.   Respiratory: Positive for chest tightness. Negative for cough, hemoptysis, shortness of breath, wheezing and stridor.   Cardiovascular: Positive for chest pain and palpitations. Negative for orthopnea, leg swelling, syncope, PND and near-syncope.  Gastrointestinal:  Negative for constipation, nausea and vomiting.  Genitourinary: Negative.   Musculoskeletal: Negative for back pain.  Skin: Negative.   Neurological: Negative for dizziness, weakness and numbness.  Hematological: Negative for adenopathy.     Physical Exam Triage Vital Signs ED Triage Vitals  Enc Vitals Group     BP 10/28/20 0815 131/89     Pulse Rate 10/28/20 0815 78     Resp 10/28/20 0815 16     Temp 10/28/20 0815 99.2 F (37.3 C)     Temp Source 10/28/20 0815 Oral     SpO2 10/28/20 0815 96 %     Weight 10/28/20 0817 235 lb (106.6 kg)     Height 10/28/20 0817 6' (1.829 m)     Head Circumference --      Peak Flow --      Pain Score 10/28/20 0817 0     Pain Loc --      Pain Edu? --      Excl. in [place]? --    No data found.  Updated Vital Signs BP 131/89 (BP Location: Right Arm)   Pulse 78   Temp 99.2 F (37.3 C) (Oral)   Resp 16   Ht 6' (1.829 m)   Wt 106.6 kg   SpO2 96%   BMI 31.87 kg/m   Visual Acuity Right Eye Distance:   Left Eye Distance:   Bilateral Distance:    Right Eye Near:   Left Eye Near:    Bilateral Near:     Physical Exam Nursing notes and Vital Signs reviewed. Appearance:  Patient appears stated age, and in no acute distress.    Eyes:  Pupils are equal, round, and reactive to light and accomodation.  Extraocular movement is intact.  Conjunctivae are not inflamed   Pharynx:  Normal; moist mucous membranes  Neck:  Supple.  No adenopathy or thyromegaly Lungs:  Clear to auscultation.  Breath sounds are equal.  Moving air well. Heart:  Regular rate and rhythm without murmurs, rubs, or gallops.  No chest tenderness to palpation.  Abdomen:  Nontender without masses or hepatosplenomegaly.  Bowel sounds are present.  No CVA or flank tenderness.  Extremities:  No edema.  Skin:  No rash present.     UC Treatments / Results  Labs (all labs ordered are listed, but only abnormal results are displayed) Labs Reviewed  CBC WITH DIFFERENTIAL/PLATELET   COMPLETE METABOLIC PANEL WITH GFR    EKG  Rate:  73 BPM PR:  134 msec QT:  374 msec QTcH:  412 msec QRSD:  94 msec QRS axis:  25 degrees Interpretation:  within normal limits; normal sinus rhythm    Review of previous records reveals no change in EKG from tracing done 03/18/17.  Radiology DG Chest 2 View  Result Date: 10/28/2020 CLINICAL DATA:  Fatigue, chest pain, shortness of breath EXAM: CHEST - 2 VIEW COMPARISON:  07/11/2020 FINDINGS: The heart size and mediastinal contours are within normal limits. Both lungs are clear. The  visualized skeletal structures are unremarkable. IMPRESSION: No acute abnormality of the lungs. Electronically Signed   By: Eddie Candle M.D.   On: 10/28/2020 09:25    Procedures Procedures (including critical care time)  Medications Ordered in UC Medications - No data to display  Initial Impression / Assessment and Plan / UC Course  I have reviewed the triage vital signs and the nursing notes.  Pertinent labs & imaging results that were available during my care of the patient were reviewed by me and considered in my medical decision making (see chart for details).    Benign exam today.  Negative chest x-ray and normal EKG reassuring.  Note lipid panel done 09/22/18 showed low risk results. CBC and CMP pending. Patient's symptoms could represent long COVID syndrome. Because of patient's strong family history of ASCVD, recommend cardiologist evaluation (event monitor, stress test, etc.).   Final Clinical Impressions(s) / UC Diagnoses   Final diagnoses:  Fatigue, unspecified type  Chills  Heart palpitations  Atypical chest pain     Discharge Instructions     Minimize activities that cause chest pain or palpitations.  If symptoms become significantly worse during the night or over the weekend, proceed to the local emergency room.    ED Prescriptions    None        Kandra Nicolas, MD 10/28/20 1007

## 2020-10-29 DIAGNOSIS — G4733 Obstructive sleep apnea (adult) (pediatric): Secondary | ICD-10-CM | POA: Diagnosis not present

## 2020-10-29 LAB — COMPLETE METABOLIC PANEL WITH GFR
AG Ratio: 1.6 (calc) (ref 1.0–2.5)
ALT: 18 U/L (ref 9–46)
AST: 15 U/L (ref 10–40)
Albumin: 4.5 g/dL (ref 3.6–5.1)
Alkaline phosphatase (APISO): 60 U/L (ref 36–130)
BUN: 13 mg/dL (ref 7–25)
CO2: 27 mmol/L (ref 20–32)
Calcium: 9.8 mg/dL (ref 8.6–10.3)
Chloride: 109 mmol/L (ref 98–110)
Creat: 1.29 mg/dL (ref 0.60–1.35)
GFR, Est African American: 75 mL/min/{1.73_m2} (ref >=60)
GFR, Est Non African American: 65 mL/min/{1.73_m2} (ref >=60)
Globulin: 2.9 g/dL (calc) (ref 1.9–3.7)
Glucose, Bld: 111 mg/dL — ABNORMAL HIGH (ref 65–99)
Potassium: 4.5 mmol/L (ref 3.5–5.3)
Sodium: 143 mmol/L (ref 135–146)
Total Bilirubin: 0.3 mg/dL (ref 0.2–1.2)
Total Protein: 7.4 g/dL (ref 6.1–8.1)

## 2020-10-29 LAB — CBC WITH DIFFERENTIAL/PLATELET
Absolute Monocytes: 484 cells/uL (ref 200–950)
Basophils Absolute: 52 cells/uL (ref 0–200)
Basophils Relative: 1 %
Eosinophils Absolute: 140 cells/uL (ref 15–500)
Eosinophils Relative: 2.7 %
HCT: 44.1 % (ref 38.5–50.0)
Hemoglobin: 15.1 g/dL (ref 13.2–17.1)
Lymphs Abs: 2070 cells/uL (ref 850–3900)
MCH: 31.7 pg (ref 27.0–33.0)
MCHC: 34.2 g/dL (ref 32.0–36.0)
MCV: 92.5 fL (ref 80.0–100.0)
MPV: 10.9 fL (ref 7.5–12.5)
Monocytes Relative: 9.3 %
Neutro Abs: 2454 cells/uL (ref 1500–7800)
Neutrophils Relative %: 47.2 %
Platelets: 264 10*3/uL (ref 140–400)
RBC: 4.77 10*6/uL (ref 4.20–5.80)
RDW: 12.5 % (ref 11.0–15.0)
Total Lymphocyte: 39.8 %
WBC: 5.2 10*3/uL (ref 3.8–10.8)

## 2020-10-29 LAB — EXTRA SPECIMEN

## 2020-10-29 LAB — TSH: TSH: 2.5 mIU/L (ref 0.40–4.50)

## 2020-10-29 LAB — BRAIN NATRIURETIC PEPTIDE: Brain Natriuretic Peptide: 4 pg/mL (ref <100)

## 2020-11-02 DIAGNOSIS — R000 Tachycardia, unspecified: Secondary | ICD-10-CM | POA: Diagnosis not present

## 2020-11-02 DIAGNOSIS — R0789 Other chest pain: Secondary | ICD-10-CM

## 2020-11-21 ENCOUNTER — Ambulatory Visit (HOSPITAL_BASED_OUTPATIENT_CLINIC_OR_DEPARTMENT_OTHER)
Admission: RE | Admit: 2020-11-21 | Discharge: 2020-11-21 | Disposition: A | Discharge status: Home / Self Care | Payer: BC Managed Care – PPO | Source: Ambulatory Visit | Attending: Family Medicine | Admitting: Family Medicine

## 2020-11-21 ENCOUNTER — Other Ambulatory Visit: Payer: Self-pay

## 2020-11-21 DIAGNOSIS — R000 Tachycardia, unspecified: Secondary | ICD-10-CM | POA: Diagnosis not present

## 2020-11-21 DIAGNOSIS — R0789 Other chest pain: Secondary | ICD-10-CM | POA: Insufficient documentation

## 2020-11-22 LAB — ECHOCARDIOGRAM COMPLETE
Area-P 1/2: 2.01 cm2
S' Lateral: 2.68 cm

## 2021-04-06 ENCOUNTER — Other Ambulatory Visit: Payer: Self-pay | Admitting: Family Medicine

## 2021-04-15 ENCOUNTER — Encounter: Payer: Self-pay | Admitting: Physician Assistant

## 2021-04-15 ENCOUNTER — Ambulatory Visit (INDEPENDENT_AMBULATORY_CARE_PROVIDER_SITE_OTHER): Payer: BC Managed Care – PPO | Admitting: Physician Assistant

## 2021-04-15 VITALS — BP 127/76 | HR 74 | Temp 99.0°F | Ht 72.0 in | Wt 228.0 lb

## 2021-04-15 DIAGNOSIS — R5383 Other fatigue: Secondary | ICD-10-CM | POA: Diagnosis not present

## 2021-04-15 DIAGNOSIS — H938X3 Other specified disorders of ear, bilateral: Secondary | ICD-10-CM

## 2021-04-15 DIAGNOSIS — H6983 Other specified disorders of Eustachian tube, bilateral: Secondary | ICD-10-CM | POA: Diagnosis not present

## 2021-04-15 DIAGNOSIS — R590 Localized enlarged lymph nodes: Secondary | ICD-10-CM | POA: Diagnosis not present

## 2021-04-15 MED ORDER — METHYLPREDNISOLONE 4 MG PO TBPK: ORAL_TABLET | ORAL | 0 refills | Status: DC

## 2021-04-16 LAB — COMPLETE METABOLIC PANEL WITH GFR
AG Ratio: 1.5 (calc) (ref 1.0–2.5)
ALT: 17 U/L (ref 9–46)
AST: 18 U/L (ref 10–40)
Albumin: 4.7 g/dL (ref 3.6–5.1)
Alkaline phosphatase (APISO): 65 U/L (ref 36–130)
BUN: 16 mg/dL (ref 7–25)
CO2: 26 mmol/L (ref 20–32)
Calcium: 9.4 mg/dL (ref 8.6–10.3)
Chloride: 105 mmol/L (ref 98–110)
Creat: 1.22 mg/dL (ref 0.60–1.29)
Globulin: 3.2 g/dL (calc) (ref 1.9–3.7)
Glucose, Bld: 89 mg/dL (ref 65–99)
Potassium: 4.3 mmol/L (ref 3.5–5.3)
Sodium: 141 mmol/L (ref 135–146)
Total Bilirubin: 0.5 mg/dL (ref 0.2–1.2)
Total Protein: 7.9 g/dL (ref 6.1–8.1)
eGFR: 73 mL/min/{1.73_m2} (ref >=60)

## 2021-04-16 LAB — CBC WITH DIFFERENTIAL/PLATELET
Absolute Monocytes: 655 cells/uL (ref 200–950)
Basophils Absolute: 59 cells/uL (ref 0–200)
Basophils Relative: 0.7 %
Eosinophils Absolute: 193 cells/uL (ref 15–500)
Eosinophils Relative: 2.3 %
HCT: 46.3 % (ref 38.5–50.0)
Hemoglobin: 15.9 g/dL (ref 13.2–17.1)
Lymphs Abs: 3167 cells/uL (ref 850–3900)
MCH: 31.3 pg (ref 27.0–33.0)
MCHC: 34.3 g/dL (ref 32.0–36.0)
MCV: 91.1 fL (ref 80.0–100.0)
MPV: 10.5 fL (ref 7.5–12.5)
Monocytes Relative: 7.8 %
Neutro Abs: 4326 cells/uL (ref 1500–7800)
Neutrophils Relative %: 51.5 %
Platelets: 300 10*3/uL (ref 140–400)
RBC: 5.08 10*6/uL (ref 4.20–5.80)
RDW: 12.4 % (ref 11.0–15.0)
Total Lymphocyte: 37.7 %
WBC: 8.4 10*3/uL (ref 3.8–10.8)

## 2021-04-16 LAB — EPSTEIN-BARR VIRUS VCA ANTIBODY PANEL
EBV NA IgG: 121 U/mL — ABNORMAL HIGH
EBV VCA IgG: 178 U/mL — ABNORMAL HIGH
EBV VCA IgM: <36 U/mL

## 2021-04-16 LAB — SEDIMENTATION RATE: Sed Rate: 2 mm/h (ref 0–15)

## 2021-04-16 NOTE — Progress Notes (Signed)
Normal WBC and differential.  Kidney, liver, glucose look great too.  Normal body sed rate as well.  Mono pending.

## 2021-04-17 ENCOUNTER — Telehealth: Payer: Self-pay | Admitting: Physician Assistant

## 2021-04-17 ENCOUNTER — Encounter: Payer: Self-pay | Admitting: Physician Assistant

## 2021-04-17 NOTE — Telephone Encounter (Signed)
Pt aware. Pt states the instructions for the prednisone are to take it in the morning, then after each mean, then at bedtime.

## 2021-04-17 NOTE — Telephone Encounter (Signed)
Mono is negative for acute/recent exposure but positive for past exposure. You have had mono before. How are you feeling?

## 2021-04-17 NOTE — Telephone Encounter (Signed)
That is a fairly common side event. Make sure to take medication in the morning.

## 2021-04-17 NOTE — Progress Notes (Signed)
Subjective:    Patient ID: Johnny Ramirez, male    DOB: Jul 04, 1971, 50 y.o.   MRN: [id number]  HPI Patient is a 50 year old male who presents to the clinic with 3 weeks of ear popping, intermittent sore throat, swollen lymph nodes.  Patient denies any other sick contacts at home.  He denies any sinus pressure, fever, cough, problems swallowing, significant nasal congestion, body aches, GI symptoms.  He has been using some Flonase with no problems.  He always has ongoing allergy-like symptoms and postnasal drip.    .. 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     Objective:   Physical Exam Vitals reviewed.  Constitutional:      Appearance: Normal appearance.  HENT:     Head: Normocephalic.     Comments: No sinus tenderness.     Right Ear: Ear canal and external ear normal. There is no impacted cerumen.     Left Ear: Ear canal and external ear normal. There is no impacted cerumen.     Ears:     Comments: Scant fluid behind both ears. No canal erythema or swelling.     Nose: Congestion present.     Comments: Bilateral swollen turbinates.    Mouth/Throat:     Mouth: Mucous membranes are moist.     Pharynx: Posterior oropharyngeal erythema present. No oropharyngeal exudate.     Comments: Lots of PND, clear.  Eyes:     Conjunctiva/sclera: Conjunctivae normal.  Neck:     Vascular: No carotid bruit.     Comments: Non tender mobile bilateral anterior cervical enlarged lymphnodes.  Cardiovascular:     Rate and Rhythm: Normal rate and regular rhythm.     Pulses: Normal  pulses.  Pulmonary:     Effort: Pulmonary effort is normal.     Breath sounds: Normal breath sounds.  Musculoskeletal:     Right lower leg: No edema.     Left lower leg: No edema.  Lymphadenopathy:     Cervical: Cervical adenopathy present.  Neurological:     General: No focal deficit present.     Mental Status: He is alert and oriented to person, place, and time.  Psychiatric:        Mood and Affect: Mood normal.          Assessment & Plan:  Marland KitchenMarland KitchenRoyce was seen today for neck pain.  Diagnoses and all orders for this visit:  Cervical lymphadenopathy -     CBC with Differential/Platelet -     Epstein-Barr virus VCA antibody panel -     COMPLETE METABOLIC PANEL WITH GFR -     Sed Rate (ESR)  Fatigue, unspecified type -     CBC with Differential/Platelet -     Epstein-Barr virus VCA antibody panel -     COMPLETE METABOLIC PANEL WITH GFR -     Sed Rate (ESR)  Ear popping, bilateral -     CBC with Differential/Platelet -     Epstein-Barr virus VCA antibody panel -     COMPLETE METABOLIC PANEL WITH GFR -  Sed Rate (ESR)  ETD (Eustachian tube dysfunction), bilateral -     methylPREDNISolone (MEDROL DOSEPAK) 4 MG TBPK tablet; Take as directed by package insert.   Unclear etiology. Symptoms for 3 weeks but do not seem consient with bacterial infection. Seems more inflammation and allergic/viral. Start medrol dose pack and flonase. Will get cbc/cmp/mono due to lymph node enlargement. Discussed lymph nodes can be reactive and take some time to go down. If not going down will get u/s of neck. Follow up as needed or if symptoms worsen.

## 2021-04-17 NOTE — Telephone Encounter (Signed)
Pt aware. He states that to his knowledge he has not had mono in the past. He states he is feeling better, the swelling has gone down a lot on the left side and a little on the right. He said the medication that was prescribed for him "wires" him up.

## 2021-04-20 NOTE — Telephone Encounter (Signed)
Pt aware of Rx dosing advisement. Pt states he is feeling much better, that the swelling is almost completely gone, but there is just a little bit still there. He states he has been tapering down on the prednisone and since doing that his sleeping has improved. No further questions or concerns at this time.

## 2021-05-12 ENCOUNTER — Other Ambulatory Visit: Payer: Self-pay

## 2021-05-12 ENCOUNTER — Ambulatory Visit (INDEPENDENT_AMBULATORY_CARE_PROVIDER_SITE_OTHER): Payer: BC Managed Care – PPO | Admitting: Family Medicine

## 2021-05-12 ENCOUNTER — Encounter: Payer: Self-pay | Admitting: Family Medicine

## 2021-05-12 VITALS — BP 114/78 | HR 74 | Ht 72.0 in | Wt 231.0 lb

## 2021-05-12 DIAGNOSIS — R599 Enlarged lymph nodes, unspecified: Secondary | ICD-10-CM | POA: Diagnosis not present

## 2021-05-12 DIAGNOSIS — J029 Acute pharyngitis, unspecified: Secondary | ICD-10-CM

## 2021-05-12 NOTE — Progress Notes (Signed)
[facility]   Acute Office Visit  Subjective:    Patient ID: Johnny Ramirez, male    DOB: 12/08/1970, 50 y.o.   MRN: [id number]  Chief Complaint  Patient presents with   Lymphadenopathy    HPI Patient is in today for throat soreness and swollen LN. he says it started a couple of weeks ago with a little bit of soreness mostly near his jaw joint on both sides.  So he came in to be evaluated.  He had some blood work with a normal sed rate normal CBC, negative for recent EBV infection.  He was given a round of steroids and says by the third day he was noticing significant improvement in his discomfort and by day 5 the swollen lymph node on the left side of his neck was completely gone down.  But within a few days it started coming back he actually feels like the soreness in his throat is more uncomfortable than it was even before the steroids he says now he is noticing a lot of discomfort when he swallows.  No fevers or chills he did not have an upper respiratory infection when his symptoms first started.  No night sweats no abnormal weight loss.  He denies any voice change.  He says now the pain is worse on the left and radiating down into his neck.    No past medical history on file.  Past Surgical History:  Procedure Laterality Date   APPENDECTOMY     as a child   HERNIA REPAIR     as a child   INGUINAL HERNIA REPAIR  2011    Family History  Problem Relation Age of Onset   Diabetes Other    Cancer Father 57    Social History   Socioeconomic History   Marital status: Single    Spouse name: Not on file   Number of children: Not on file   Years of education: Not on file   Highest education level: Not on file  Occupational History   Not on file  Tobacco Use   Smoking status: Never   Smokeless tobacco: Never  Vaping Use   Vaping Use: Never used  Substance and Sexual Activity   Alcohol use: Yes    Alcohol/week: 8.0 standard drinks    Types: 8 Standard drinks or equivalent per week     Comment: weekly/ weekends   Drug use: No   Sexual activity: Yes  Other Topics Concern   Not on file  Social History Narrative   No regular exercise.        Social Determinants of Health   Financial Resource Strain: Not on file  Food Insecurity: Not on file  Transportation Needs: Not on file  Physical Activity: Not on file  Stress: Not on file  Social Connections: Not on file  Intimate Partner Violence: Not on file    Outpatient Medications Prior to Visit  Medication Sig Dispense Refill   Divide Medication Name: CPAP set to 9 cm water pressure with humidifier and supplies. Please fit for mask.  Dx OSA. 1 vial 0   Multiple Vitamins-Minerals (MENS MULTI VITAMIN & MINERAL PO) Take 1 tablet by mouth daily.     omeprazole (PRILOSEC) 20 MG capsule Take 20 mg by mouth daily.     methylPREDNISolone (MEDROL DOSEPAK) 4 MG TBPK tablet Take as directed by package insert. 21 tablet 0   No facility-administered medications prior to visit.    No Known Allergies  Review of Systems     Objective:    Physical Exam Constitutional:      Appearance: He is well-developed.  HENT:     Head: Normocephalic and atraumatic.     Right Ear: External ear normal.     Left Ear: External ear normal.     Nose: Nose normal.  Eyes:     Conjunctiva/sclera: Conjunctivae normal.     Pupils: Pupils are equal, round, and reactive to light.  Neck:     Thyroid: No thyromegaly.      Comments: He does have a palpable oblong cervical lymph node on the left.  Measuring maybe 0.5 x 1 cm.  It is nonmobile but soft.  He is also tender just along the edge of his left TMJ joint as well.  Nontender underneath the chin. Cardiovascular:     Rate and Rhythm: Normal rate.     Heart sounds: Normal heart sounds.  Pulmonary:     Effort: Pulmonary effort is normal.     Breath sounds: Normal breath sounds.  Musculoskeletal:     Cervical back: Neck supple.  Lymphadenopathy:     Cervical:  No cervical adenopathy.  Skin:    General: Skin is warm and dry.  Neurological:     Mental Status: He is alert and oriented to person, place, and time.    BP 114/78   Pulse 74   Ht 6' (1.829 m)   Wt 231 lb (104.8 kg)   SpO2 98%   BMI 31.33 kg/m  Wt Readings from Last 3 Encounters:  05/12/21 231 lb (104.8 kg)  04/15/21 228 lb (103.4 kg)  10/28/20 234 lb (106.1 kg)    Health Maintenance Due  Topic Date Due   COVID-19 Vaccine (1) Never done   INFLUENZA VACCINE  04/13/2021    There are no preventive care reminders to display for this patient.   Lab Results  Component Value Date   TSH 2.50 10/28/2020   Lab Results  Component Value Date   WBC 8.4 04/15/2021   HGB 15.9 04/15/2021   HCT 46.3 04/15/2021   MCV 91.1 04/15/2021   PLT 300 04/15/2021   Lab Results  Component Value Date   NA 141 04/15/2021   K 4.3 04/15/2021   CO2 26 04/15/2021   GLUCOSE 89 04/15/2021   BUN 16 04/15/2021   CREATININE 1.22 04/15/2021   BILITOT 0.5 04/15/2021   ALKPHOS 57 11/04/2016   AST 18 04/15/2021   ALT 17 04/15/2021   PROT 7.9 04/15/2021   ALBUMIN 4.5 11/04/2016   CALCIUM 9.4 04/15/2021   EGFR 73 04/15/2021   Lab Results  Component Value Date   CHOL 160 09/22/2018   Lab Results  Component Value Date   HDL 47 09/22/2018   Lab Results  Component Value Date   LDLCALC 99 09/22/2018   Lab Results  Component Value Date   TRIG 58 09/22/2018   Lab Results  Component Value Date   CHOLHDL 3.4 09/22/2018   Lab Results  Component Value Date   HGBA1C 5.6 09/22/2018       Assessment & Plan:   Problem List Items Addressed This Visit   None Visit Diagnoses     Sore throat    -  Primary   Relevant Orders   CMV abs, IgG+IgM (cytomegalovirus)   US Soft Tissue Head/Neck (NON-THYROID)   Swollen lymph nodes       Relevant Orders   CMV abs, IgG+IgM (cytomegalovirus)   US Soft Tissue  Head/Neck (NON-THYROID)        No orders of the defined types were placed in this  encounter.    Beatrice Lecher, MD

## 2021-05-15 ENCOUNTER — Ambulatory Visit (INDEPENDENT_AMBULATORY_CARE_PROVIDER_SITE_OTHER): Payer: BC Managed Care – PPO

## 2021-05-15 ENCOUNTER — Other Ambulatory Visit: Payer: Self-pay

## 2021-05-15 DIAGNOSIS — R590 Localized enlarged lymph nodes: Secondary | ICD-10-CM | POA: Diagnosis not present

## 2021-05-15 DIAGNOSIS — J029 Acute pharyngitis, unspecified: Secondary | ICD-10-CM | POA: Diagnosis not present

## 2021-05-15 DIAGNOSIS — R599 Enlarged lymph nodes, unspecified: Secondary | ICD-10-CM

## 2021-05-15 NOTE — Progress Notes (Signed)
[facility], ultrasound just confirms benign-appearing lymph nodes that they feel are most consistent with just inflammation/infectious process.  Reviewing that they were swollen responding to some type of either inflammation or infection.  They said that they look pretty stable compared to previous image done on January 2021.  So we will continue to monitor if at any point if you like they are getting larger we can always repeat the ultrasound otherwise should hopefully improve over the next several weeks.

## 2021-10-19 ENCOUNTER — Encounter: Payer: Self-pay | Admitting: Family Medicine

## 2021-10-19 ENCOUNTER — Other Ambulatory Visit: Payer: Self-pay

## 2021-10-19 ENCOUNTER — Ambulatory Visit (INDEPENDENT_AMBULATORY_CARE_PROVIDER_SITE_OTHER): Payer: BC Managed Care – PPO | Admitting: Family Medicine

## 2021-10-19 VITALS — BP 138/74 | HR 74 | Resp 18 | Ht 72.0 in | Wt 232.0 lb

## 2021-10-19 DIAGNOSIS — Z0000 Encounter for general adult medical examination without abnormal findings: Secondary | ICD-10-CM | POA: Diagnosis not present

## 2021-10-19 DIAGNOSIS — Z1211 Encounter for screening for malignant neoplasm of colon: Secondary | ICD-10-CM | POA: Diagnosis not present

## 2021-10-19 DIAGNOSIS — N529 Male erectile dysfunction, unspecified: Secondary | ICD-10-CM

## 2021-10-19 DIAGNOSIS — D229 Melanocytic nevi, unspecified: Secondary | ICD-10-CM

## 2021-10-19 DIAGNOSIS — K209 Esophagitis, unspecified without bleeding: Secondary | ICD-10-CM

## 2021-10-19 HISTORY — DX: Male erectile dysfunction, unspecified: N52.9

## 2021-10-19 MED ORDER — SILDENAFIL CITRATE 100 MG PO TABS
50.0000 mg | ORAL_TABLET | Freq: Every day | ORAL | 11 refills | Status: DC | PRN
Start: 2021-10-19 — End: 2022-11-10

## 2021-10-19 MED ORDER — OMEPRAZOLE 20 MG PO CPDR: 20.0000 mg | DELAYED_RELEASE_CAPSULE | Freq: Every day | ORAL | 3 refills | Status: DC

## 2021-10-19 NOTE — Progress Notes (Addendum)
Established Patient Office Visit  Subjective:  Patient ID: Johnny Ramirez, male    DOB: 1971-07-16  Age: 50 y.o. MRN: [id number]  CC:  Chief Complaint  Patient presents with   Annual Exam    Fasting    Referral    Moles on back. Would like referral to [facility]     HPI Johnny Ramirez presents for CPE.  He is doing well overall.  He would like me to look at the moles on his back his daughter works in dermatology office and she was concerned about a few of them.  He does have a larger birthmark on the left upper shoulder.  He has not had colon cancer screening done yet.  He is due for labs.  He is also interested in a prescription for sildenafil.  He has taken it before and says he has not had any problems.  He has had a slight headache occasionally after using it but no vision changes or chest pain etc.  History reviewed. No pertinent past medical history.  Past Surgical History:  Procedure Laterality Date   APPENDECTOMY     as a child   HERNIA REPAIR     as a child   INGUINAL HERNIA REPAIR  2011    Family History  Problem Relation Age of Onset   Healthy Mother    Cancer Father 28   Healthy Sister    Healthy Sister    Healthy Sister    Diabetes Other     Social History   Socioeconomic History   Marital status: Single    Spouse name: Not on file   Number of children: 2   Years of education: Not on file   Highest education level: Not on file  Occupational History   Not on file  Tobacco Use   Smoking status: Never   Smokeless tobacco: Never  Vaping Use   Vaping Use: Never used  Substance and Sexual Activity   Alcohol use: Yes    Alcohol/week: 1.0 standard drink    Types: 1 Standard drinks or equivalent per week    Comment: beer once a week   Drug use: No   Sexual activity: Yes  Other Topics Concern   Not on file  Social History Narrative   No regular exercise.  1 cup of coffee a day    Social Determinants of Health   Financial Resource  Strain: Not on file  Food Insecurity: Not on file  Transportation Needs: Not on file  Physical Activity: Not on file  Stress: Not on file  Social Connections: Not on file  Intimate Partner Violence: Not on file    Outpatient Medications Prior to Visit  Medication Sig Dispense Refill   [place] Medication Name: CPAP set to 9 cm water pressure with humidifier and supplies. Please fit for mask.  Dx OSA. 1 vial 0   Multiple Vitamins-Minerals (MENS MULTI VITAMIN & MINERAL PO) Take 1 tablet by mouth daily.     omeprazole (PRILOSEC) 20 MG capsule Take 20 mg by mouth daily.     No facility-administered medications prior to visit.    No Known Allergies  ROS Review of Systems    Objective:    Physical Exam Constitutional:      Appearance: He is well-developed.  HENT:     Head: Normocephalic and atraumatic.     Right Ear: External ear normal.     Left Ear: External ear normal.     Nose:  Nose normal.  Eyes:     Conjunctiva/sclera: Conjunctivae normal.     Pupils: Pupils are equal, round, and reactive to light.  Neck:     Thyroid: No thyromegaly.  Cardiovascular:     Rate and Rhythm: Normal rate and regular rhythm.     Heart sounds: Normal heart sounds.  Pulmonary:     Effort: Pulmonary effort is normal.     Breath sounds: Normal breath sounds.  Abdominal:     General: Bowel sounds are normal. There is no distension.     Palpations: Abdomen is soft. There is no mass.     Tenderness: There is no abdominal tenderness. There is no guarding or rebound.  Musculoskeletal:        General: Normal range of motion.     Cervical back: Normal range of motion and neck supple.  Lymphadenopathy:     Cervical: No cervical adenopathy.  Skin:    General: Skin is warm and dry.  Neurological:     Mental Status: He is alert and oriented to person, place, and time.     Deep Tendon Reflexes: Reflexes are normal and symmetric.  Psychiatric:        Behavior: Behavior  normal.        Thought Content: Thought content normal.        Judgment: Judgment normal.    BP 138/74    Pulse 74    Resp 18    Ht 6' (1.829 m)    Wt 232 lb (105.2 kg)    SpO2 98%    BMI 31.46 kg/m  Wt Readings from Last 3 Encounters:  10/19/21 232 lb (105.2 kg)  05/12/21 231 lb (104.8 kg)  04/15/21 228 lb (103.4 kg)     There are no preventive care reminders to display for this patient.   There are no preventive care reminders to display for this patient.  Lab Results  Component Value Date   TSH 2.50 10/28/2020   Lab Results  Component Value Date   WBC 8.4 04/15/2021   HGB 15.9 04/15/2021   HCT 46.3 04/15/2021   MCV 91.1 04/15/2021   PLT 300 04/15/2021   Lab Results  Component Value Date   NA 141 04/15/2021   K 4.3 04/15/2021   CO2 26 04/15/2021   GLUCOSE 89 04/15/2021   BUN 16 04/15/2021   CREATININE 1.22 04/15/2021   BILITOT 0.5 04/15/2021   ALKPHOS 57 11/04/2016   AST 18 04/15/2021   ALT 17 04/15/2021   PROT 7.9 04/15/2021   ALBUMIN 4.5 11/04/2016   CALCIUM 9.4 04/15/2021   EGFR 73 04/15/2021   Lab Results  Component Value Date   CHOL 160 09/22/2018   Lab Results  Component Value Date   HDL 47 09/22/2018   Lab Results  Component Value Date   LDLCALC 99 09/22/2018   Lab Results  Component Value Date   TRIG 58 09/22/2018   Lab Results  Component Value Date   CHOLHDL 3.4 09/22/2018   Lab Results  Component Value Date   HGBA1C 5.6 09/22/2018      Assessment & Plan:   Problem List Items Addressed This Visit       Digestive   Acute esophagitis   Relevant Medications   omeprazole (PRILOSEC) 20 MG capsule     Other   ED (erectile dysfunction)    would like to retry Viagra.        Other Visit Diagnoses     Wellness examination    -  Primary   Relevant Orders   Lipid Panel w/reflex Direct LDL   COMPLETE METABOLIC PANEL WITH GFR   CBC   Hemoglobin A1c   Skin mole       Colon cancer screening       Relevant Orders    Ambulatory referral to Gastroenterology      Keep up a regular exercise program and make sure you are eating a healthy diet Try to eat 4 servings of dairy a day, or if you are lactose intolerant take a calcium with vitamin D daily.  Your vaccines are up to date.  Ref to GI for screening colonoscopy.    Meds ordered this encounter  Medications   omeprazole (PRILOSEC) 20 MG capsule    Sig: Take 1 capsule (20 mg total) by mouth daily.    Dispense:  90 capsule    Refill:  3   sildenafil (VIAGRA) 100 MG tablet    Sig: Take 0.5-1 tablets (50-100 mg total) by mouth daily as needed for erectile dysfunction.    Dispense:  10 tablet    Refill:  11    Follow-up: Return in about 1 year (around 10/19/2022) for Wellness Exam.    Beatrice Lecher, MD

## 2021-10-19 NOTE — Assessment & Plan Note (Signed)
would like to retry Viagra.

## 2021-10-20 LAB — CBC
HCT: 45.6 % (ref 38.5–50.0)
Hemoglobin: 15.3 g/dL (ref 13.2–17.1)
MCH: 31.2 pg (ref 27.0–33.0)
MCHC: 33.6 g/dL (ref 32.0–36.0)
MCV: 92.9 fL (ref 80.0–100.0)
MPV: 11.4 fL (ref 7.5–12.5)
Platelets: 265 10*3/uL (ref 140–400)
RBC: 4.91 10*6/uL (ref 4.20–5.80)
RDW: 12.4 % (ref 11.0–15.0)
WBC: 6.5 10*3/uL (ref 3.8–10.8)

## 2021-10-20 LAB — COMPLETE METABOLIC PANEL WITH GFR
AG Ratio: 1.6 (calc) (ref 1.0–2.5)
ALT: 15 U/L (ref 9–46)
AST: 16 U/L (ref 10–35)
Albumin: 4.6 g/dL (ref 3.6–5.1)
Alkaline phosphatase (APISO): 63 U/L (ref 35–144)
BUN: 14 mg/dL (ref 7–25)
CO2: 28 mmol/L (ref 20–32)
Calcium: 9.3 mg/dL (ref 8.6–10.3)
Chloride: 105 mmol/L (ref 98–110)
Creat: 1.08 mg/dL (ref 0.70–1.30)
Globulin: 2.8 g/dL (calc) (ref 1.9–3.7)
Glucose, Bld: 104 mg/dL — ABNORMAL HIGH (ref 65–99)
Potassium: 3.9 mmol/L (ref 3.5–5.3)
Sodium: 141 mmol/L (ref 135–146)
Total Bilirubin: 0.7 mg/dL (ref 0.2–1.2)
Total Protein: 7.4 g/dL (ref 6.1–8.1)
eGFR: 84 mL/min/{1.73_m2} (ref >=60)

## 2021-10-20 LAB — LIPID PANEL W/REFLEX DIRECT LDL
Cholesterol: 151 mg/dL (ref <200)
HDL: 59 mg/dL (ref >=40)
LDL Cholesterol (Calc): 76 mg/dL (calc)
Non-HDL Cholesterol (Calc): 92 mg/dL (calc) (ref <130)
Total CHOL/HDL Ratio: 2.6 (calc) (ref <5.0)
Triglycerides: 76 mg/dL (ref <150)

## 2021-10-20 LAB — HEMOGLOBIN A1C
Hgb A1c MFr Bld: 5.4 % of total Hgb (ref <5.7)
Mean Plasma Glucose: 108 mg/dL
eAG (mmol/L): 6 mmol/L

## 2021-10-20 NOTE — Progress Notes (Signed)
Hi Johnny Ramirez, overall labs look good.  No sign of diabetes.  We are working on your referral to GI so just let us know if you do not hear from them within 5 business days.

## 2021-11-06 DIAGNOSIS — L821 Other seborrheic keratosis: Secondary | ICD-10-CM | POA: Diagnosis not present

## 2021-11-06 DIAGNOSIS — D225 Melanocytic nevi of trunk: Secondary | ICD-10-CM | POA: Diagnosis not present

## 2021-11-06 DIAGNOSIS — L814 Other melanin hyperpigmentation: Secondary | ICD-10-CM | POA: Diagnosis not present

## 2022-02-11 DIAGNOSIS — K648 Other hemorrhoids: Secondary | ICD-10-CM | POA: Diagnosis not present

## 2022-02-11 DIAGNOSIS — K621 Rectal polyp: Secondary | ICD-10-CM | POA: Diagnosis not present

## 2022-02-11 DIAGNOSIS — Z1211 Encounter for screening for malignant neoplasm of colon: Secondary | ICD-10-CM | POA: Diagnosis not present

## 2022-02-11 LAB — HM COLONOSCOPY

## 2022-07-06 IMAGING — DX DG CHEST 2V
2 series · 2 of 2 positions shown · non-contrast
Comparison: January 15, 2020

CLINICAL DATA: Cough and fever.  UPWIT-Q2 positive

EXAM:
CHEST - 2 VIEW

[chest pa]
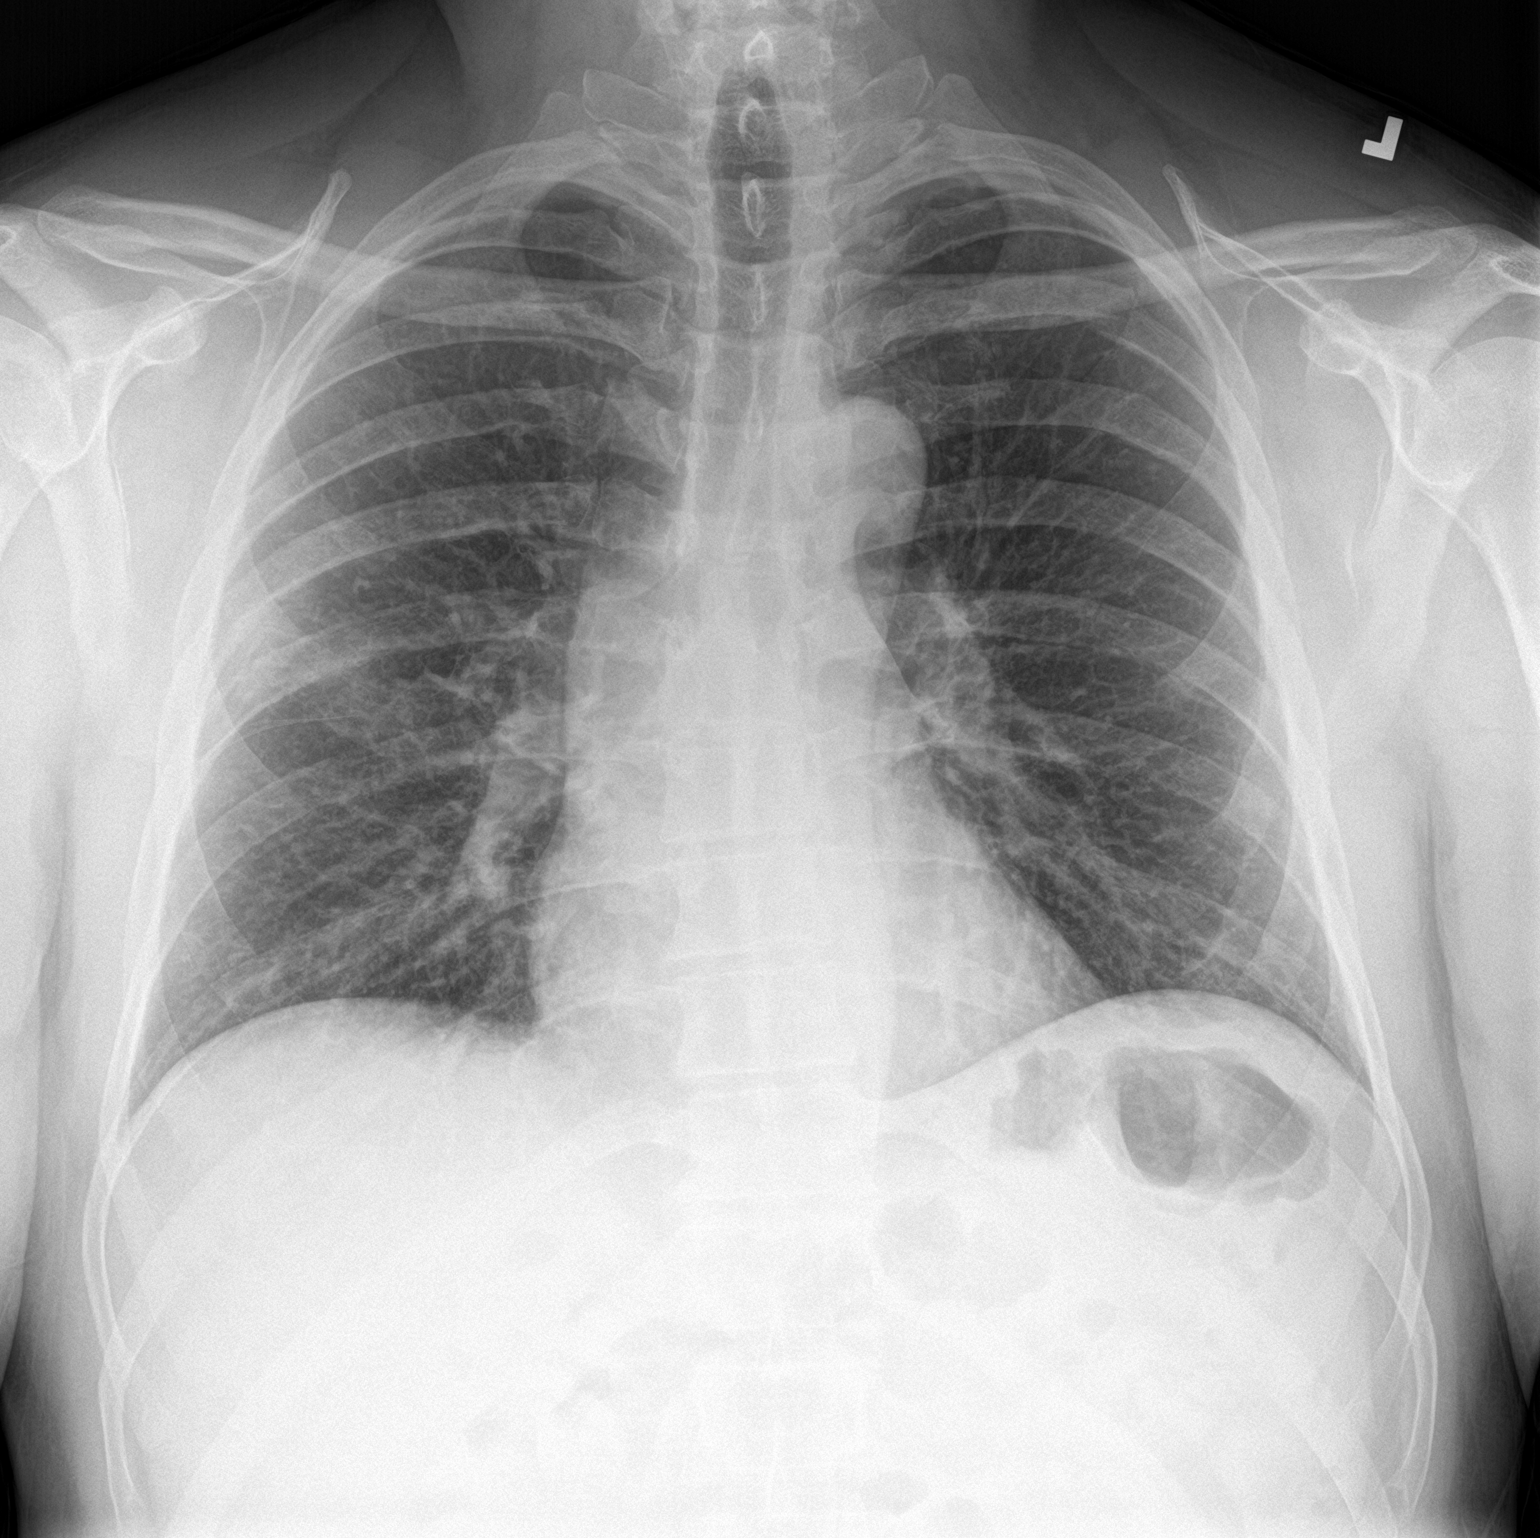

[chest lat]
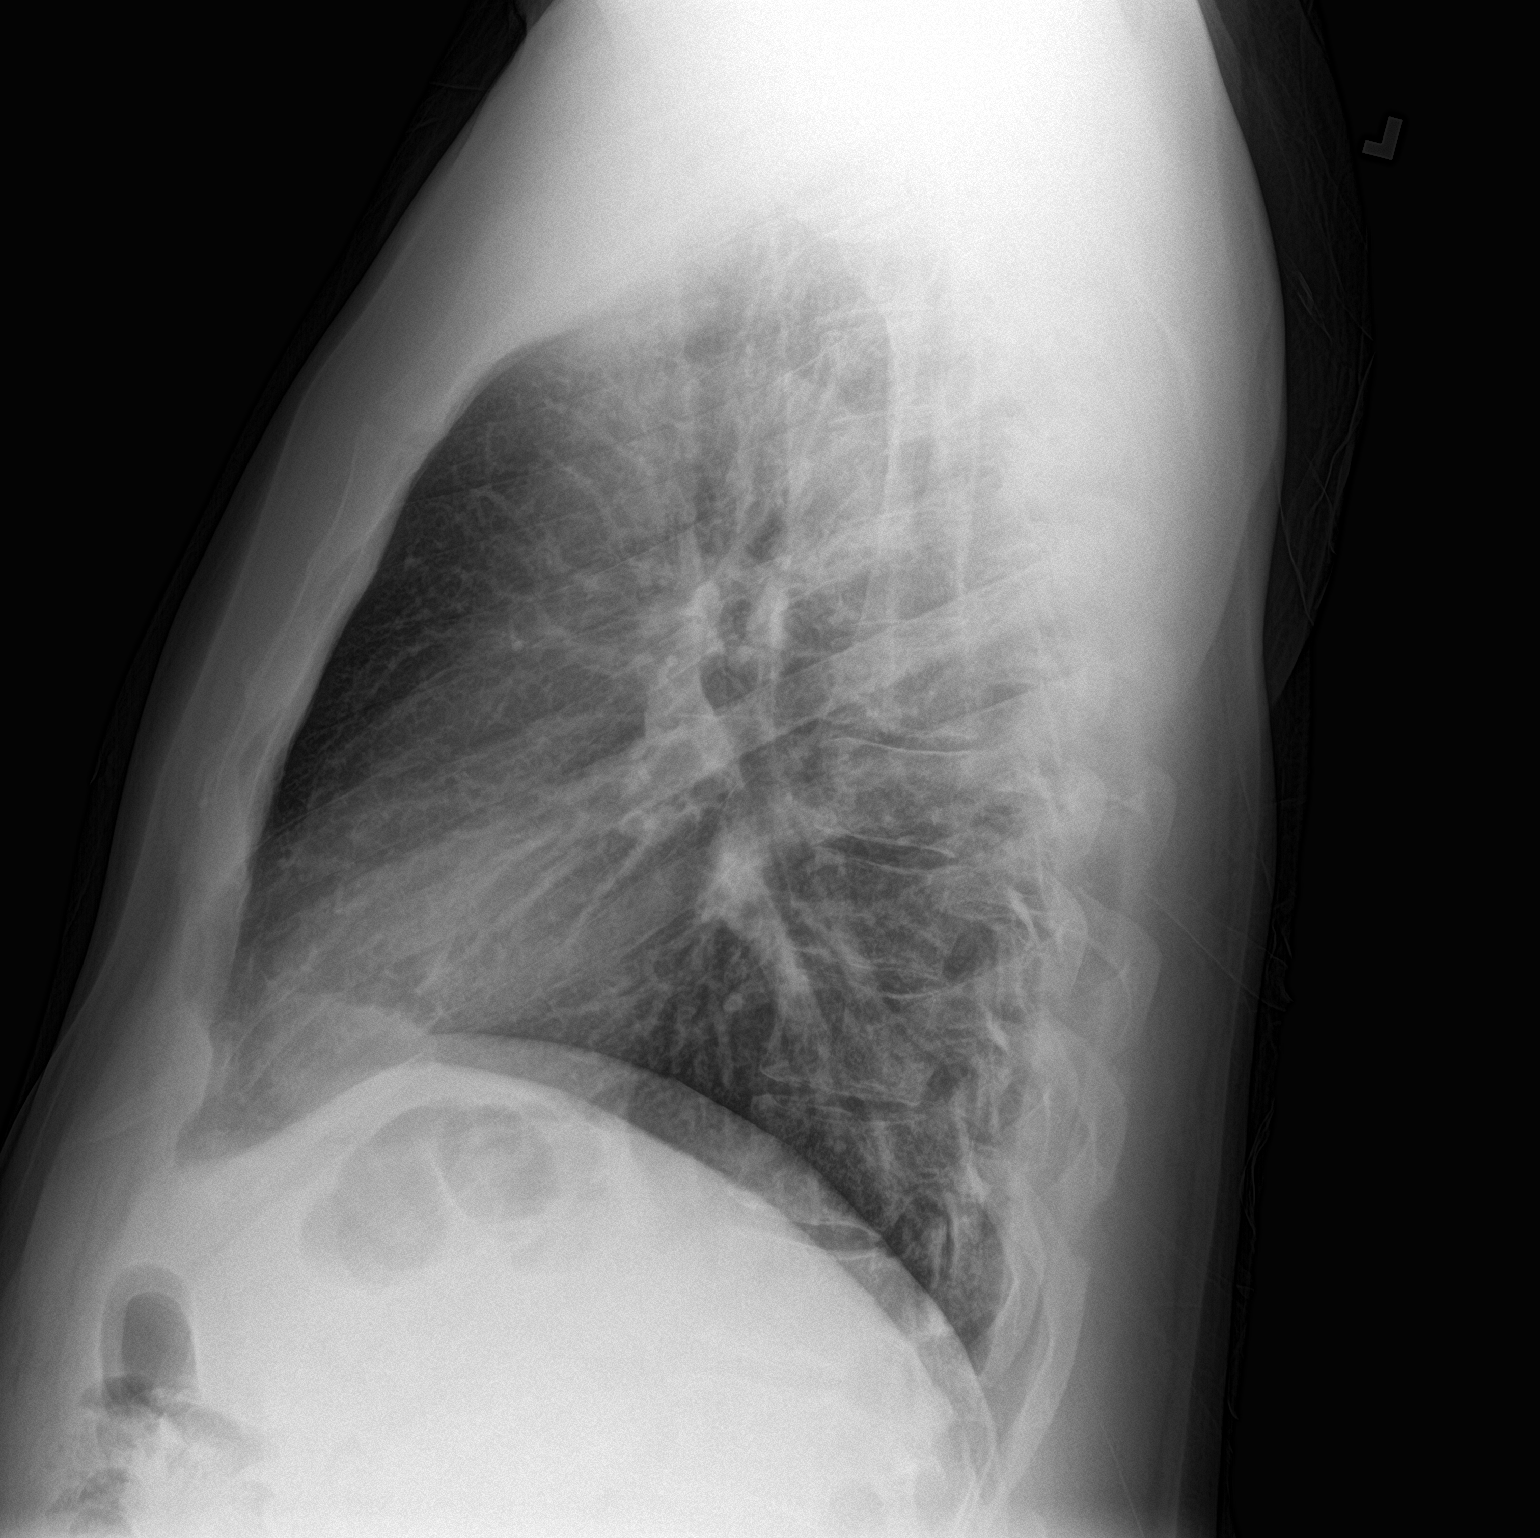

[2 of 2 positions shown; findings below may reference images not displayed]

FINDINGS: There is a subtle area of ill-defined opacity in the periphery of
the right mid lung. Lungs elsewhere are clear. Heart size and
pulmonary vascularity are normal. No adenopathy. No bone lesions.
IMPRESSION: Subtle area of opacity in the periphery of the right mid lung, an
apparent small focus of pneumonia. Lungs elsewhere clear. Heart size
normal. No adenopathy.

These results will be called to the ordering clinician or
representative by the Radiologist Assistant, and communication
documented in the PACS or [REDACTED].

## 2022-11-10 ENCOUNTER — Ambulatory Visit (INDEPENDENT_AMBULATORY_CARE_PROVIDER_SITE_OTHER): Payer: BC Managed Care – PPO | Admitting: Family Medicine

## 2022-11-10 ENCOUNTER — Encounter: Payer: Self-pay | Admitting: Family Medicine

## 2022-11-10 VITALS — BP 107/75 | HR 78 | Ht 72.0 in | Wt 206.0 lb

## 2022-11-10 DIAGNOSIS — J301 Allergic rhinitis due to pollen: Secondary | ICD-10-CM

## 2022-11-10 DIAGNOSIS — K209 Esophagitis, unspecified without bleeding: Secondary | ICD-10-CM | POA: Diagnosis not present

## 2022-11-10 DIAGNOSIS — Z0000 Encounter for general adult medical examination without abnormal findings: Secondary | ICD-10-CM

## 2022-11-10 MED ORDER — FLUTICASONE PROPIONATE 50 MCG/ACT NA SUSP: 2.0000 | Freq: Every day | NASAL | 5 refills | Status: DC

## 2022-11-10 MED ORDER — OMEPRAZOLE 20 MG PO CPDR: 20.0000 mg | DELAYED_RELEASE_CAPSULE | Freq: Every day | ORAL | 3 refills | Status: DC

## 2022-11-10 MED ORDER — SILDENAFIL CITRATE 100 MG PO TABS: 50.0000 mg | ORAL_TABLET | Freq: Every day | ORAL | 11 refills | Status: DC | PRN

## 2022-11-10 NOTE — Progress Notes (Addendum)
Complete physical exam  Patient: Johnny Ramirez   DOB: December 24, 1970   52 y.o. Male  MRN: [id number]  Subjective:    Chief Complaint  Patient presents with   Annual Exam    Johnny Ramirez is a 52 y.o. male who presents today for a complete physical exam. He reports consuming a general diet.  Walking for execise. Eating more healthy. Has lost 30 lbs.   He generally feels well. Marland Kitchen He does have additional problems to discuss today. Had colonoscopy over the summer.   Reports some nasal congestion over the last week.  It is definitely worse on the left maxillary cheek underneath the eye.  No fevers or chills.  No sore throat no cough.  He thinks it may be allergic related.  Tree pollens have been up here locally.  Most recent fall risk assessment:    11/10/2022    9:58 AM  Fall Risk   Falls in the past year? 0  Number falls in past yr: 0  Injury with Fall? 0  Risk for fall due to : No Fall Risks  Follow up Falls evaluation completed     Most recent depression screenings:    11/10/2022    9:58 AM 10/19/2021    8:02 AM  PHQ 2/9 Scores  PHQ - 2 Score 0 0         Patient Care Team: Hali Marry, MD as PCP - General (Family Medicine)   Outpatient Medications Prior to Visit  Medication Sig   AMBULATORY NON FORMULARY MEDICATION Medication Name: CPAP set to 9 cm water pressure with humidifier and supplies. Please fit for mask.  Dx OSA.   Multiple Vitamins-Minerals (MENS MULTI VITAMIN & MINERAL PO) Take 1 tablet by mouth daily.   [DISCONTINUED] omeprazole (PRILOSEC) 20 MG capsule Take 1 capsule (20 mg total) by mouth daily.   [DISCONTINUED] sildenafil (VIAGRA) 100 MG tablet Take 0.5-1 tablets (50-100 mg total) by mouth daily as needed for erectile dysfunction.   No facility-administered medications prior to visit.    ROS        Objective:     BP 107/75   Pulse 78   Ht 6' (1.829 m)   Wt 206 lb (93.4 kg)   SpO2 98%   BMI 27.94 kg/m     Physical  Exam Constitutional:      Appearance: He is well-developed.  HENT:     Head: Normocephalic and atraumatic.  Cardiovascular:     Rate and Rhythm: Normal rate and regular rhythm.     Heart sounds: Normal heart sounds.  Pulmonary:     Effort: Pulmonary effort is normal.     Breath sounds: Normal breath sounds.  Skin:    General: Skin is warm and dry.  Neurological:     Mental Status: He is alert and oriented to person, place, and time.  Psychiatric:        Behavior: Behavior normal.      Results for orders placed or performed in visit on 11/10/22  HM COLONOSCOPY  Result Value Ref Range   HM Colonoscopy See Report (in chart) See Report (in chart), Patient Reported       Assessment & Plan:    Routine Health Maintenance and Physical Exam  Immunization History  Administered Date(s) Administered   Influenza,inj,Quad PF,6+ Mos 09/22/2018   Tdap 07/23/2013, 08/22/2019    Health Maintenance  Topic Date Due   INFLUENZA VACCINE  12/12/2022 (Originally 04/13/2022)   Hepatitis C Screening  11/11/2023 (  Originally 09/22/1989)   COVID-19 Vaccine (1) 11/26/2023 (Originally 03/22/1971)   Zoster Vaccines- Shingrix (1 of 2) 02/08/2024 (Originally 09/22/2021)   HIV Screening  07/26/2024 (Originally 09/22/1986)   DTaP/Tdap/Td (3 - Td or Tdap) 08/21/2029   COLONOSCOPY (Pts 45-31yr Insurance coverage will need to be confirmed)  02/12/2032   Pneumococcal Vaccine 16639Years old  Aged Out   HPV VACCINES  Aged Out    Discussed health benefits of physical activity, and encouraged him to engage in regular exercise appropriate for his age and condition.  Problem List Items Addressed This Visit       Digestive   Acute esophagitis   Relevant Medications   omeprazole (PRILOSEC) 20 MG capsule   Other Visit Diagnoses     Wellness examination    -  Primary   Relevant Orders   HgB A1c   COMPLETE METABOLIC PANEL WITH GFR   Lipid Panel w/reflex Direct LDL   PSA   Non-seasonal allergic rhinitis  due to pollen           Keep up a regular exercise program and make sure you are eating a healthy diet Try to eat 4 servings of dairy a day, or if you are lactose intolerant take a calcium with vitamin D daily.  Your vaccines are up to date.   Rhinitis-recommend a trial of fluticasone nasal spray.  If not better in 1 week consider treatment for bacterial sinusitis.  No follow-ups on file.     CBeatrice Lecher MD

## 2022-11-10 NOTE — Addendum Note (Signed)
Addended by: Beatrice Lecher D on: 11/10/2022 01:18 PM   Modules accepted: Orders

## 2022-11-11 LAB — COMPLETE METABOLIC PANEL WITH GFR
AG Ratio: 1.5 (calc) (ref 1.0–2.5)
ALT: 18 U/L (ref 9–46)
AST: 20 U/L (ref 10–35)
Albumin: 4.7 g/dL (ref 3.6–5.1)
Alkaline phosphatase (APISO): 72 U/L (ref 35–144)
BUN: 11 mg/dL (ref 7–25)
CO2: 30 mmol/L (ref 20–32)
Calcium: 9.7 mg/dL (ref 8.6–10.3)
Chloride: 105 mmol/L (ref 98–110)
Creat: 1.05 mg/dL (ref 0.70–1.30)
Globulin: 3.1 g/dL (calc) (ref 1.9–3.7)
Glucose, Bld: 92 mg/dL (ref 65–99)
Potassium: 4.4 mmol/L (ref 3.5–5.3)
Sodium: 141 mmol/L (ref 135–146)
Total Bilirubin: 0.9 mg/dL (ref 0.2–1.2)
Total Protein: 7.8 g/dL (ref 6.1–8.1)
eGFR: 86 mL/min/{1.73_m2} (ref >=60)

## 2022-11-11 LAB — PSA: PSA: 1.23 ng/mL (ref <=4.00)

## 2022-11-11 LAB — HEMOGLOBIN A1C
Hgb A1c MFr Bld: 5.5 % of total Hgb (ref <5.7)
Mean Plasma Glucose: 111 mg/dL
eAG (mmol/L): 6.2 mmol/L

## 2022-11-11 LAB — LIPID PANEL W/REFLEX DIRECT LDL
Cholesterol: 180 mg/dL (ref <200)
HDL: 73 mg/dL (ref >=40)
LDL Cholesterol (Calc): 94 mg/dL (calc)
Non-HDL Cholesterol (Calc): 107 mg/dL (calc) (ref <130)
Total CHOL/HDL Ratio: 2.5 (calc) (ref <5.0)
Triglycerides: 43 mg/dL (ref <150)

## 2022-11-11 NOTE — Progress Notes (Signed)
Hi Johnny Ramirez your A1c looks great at 5.5 no sign of diabetes or prediabetes.  Your metabolic panel is normal.  Your LDL cholesterol is up a bit.  It was down to 76 a year ago it is now 74.  I encourage you to continue to work on healthy diet and regular exercise.  Prostate test is normal.

## 2022-11-17 ENCOUNTER — Telehealth: Payer: Self-pay

## 2022-11-17 NOTE — Telephone Encounter (Signed)
Called patient to let him know his physician form that [email or web address] filled out its ready for pickup. Patient currently in [place] and will be back Friday to come pick it up at front desk.

## 2022-12-12 ENCOUNTER — Ambulatory Visit
Admission: EM | Admit: 2022-12-12 | Discharge: 2022-12-12 | Disposition: A | Discharge status: Home / Self Care | Payer: BC Managed Care – PPO | Attending: Family Medicine | Admitting: Family Medicine

## 2022-12-12 ENCOUNTER — Encounter: Payer: Self-pay | Admitting: Emergency Medicine

## 2022-12-12 DIAGNOSIS — R062 Wheezing: Secondary | ICD-10-CM | POA: Diagnosis not present

## 2022-12-12 DIAGNOSIS — T7840XA Allergy, unspecified, initial encounter: Secondary | ICD-10-CM | POA: Diagnosis not present

## 2022-12-12 DIAGNOSIS — J209 Acute bronchitis, unspecified: Secondary | ICD-10-CM | POA: Diagnosis not present

## 2022-12-12 MED ORDER — PREDNISONE 20 MG PO TABS: ORAL_TABLET | ORAL | 0 refills | Status: DC

## 2022-12-12 MED ORDER — AMOXICILLIN-POT CLAVULANATE 875-125 MG PO TABS: 1.0000 | ORAL_TABLET | Freq: Two times a day (BID) | ORAL | 0 refills | Status: DC

## 2022-12-12 NOTE — ED Triage Notes (Signed)
Cough x 1 week No temp Pneumonia in past  Congestion OTC  sudafed,  cough & cold meds - no relief  Here w/ s/o

## 2022-12-12 NOTE — ED Provider Notes (Signed)
[name]    CSN: [id number] Arrival date & time: 12/12/22  1548      History   Chief Complaint Chief Complaint  Patient presents with   Cough    HPI Johnny Ramirez is a 52 y.o. male.   HPI  Patient has had sinus congestion for over a week.  This morning he woke up with chest congestion, coughing up sputum, and shortness of breath.  He states that his infection feels like it is getting worse.  He is a non-smoker.  He does not have asthma but does have underlying allergies.  History reviewed. No pertinent past medical history.  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 Authorizing Provider  amoxicillin-clavulanate (AUGMENTIN) 875-125 MG tablet Take 1 tablet by mouth every 12 (twelve) hours. 12/12/22  Yes Raylene Everts, MD  predniSONE (DELTASONE) 20 MG tablet Take 2 pills once a day for 5 days, then 1 pill once a day for 5 days, then discontinue 12/12/22  Yes Raylene Everts, MD  AMBULATORY NON FORMULARY MEDICATION Medication Name: CPAP set to 9 cm water pressure with humidifier and supplies. Please fit for mask.  Dx OSA. 11/27/18   Hali Marry, MD  fluticasone (FLONASE) 50 MCG/ACT nasal spray Place 2 sprays into both nostrils daily. 11/10/22   Hali Marry, MD  Multiple Vitamins-Minerals (MENS MULTI VITAMIN & MINERAL PO) Take 1 tablet by mouth daily.    [provider]  omeprazole (PRILOSEC) 20 MG capsule Take 1 capsule (20 mg  total) by mouth daily. 11/10/22   Hali Marry, MD  sildenafil (VIAGRA) 100 MG tablet Take 0.5-1 tablets (50-100 mg total) by mouth daily as needed for erectile dysfunction. 11/10/22   Hali Marry, MD    Family History Family History  Problem Relation Age of Onset   Healthy Mother    Cancer Father 72   Healthy Sister    Healthy Sister    Healthy Sister    Diabetes Other     Social History Social History   Tobacco Use   Smoking status: Never   Smokeless tobacco: Never  Vaping Use   Vaping Use: Never used  Substance Use Topics   Alcohol use: Yes    Alcohol/week: 1.0 standard drink of alcohol    Types: 1 Standard drinks or equivalent per week    Comment: beer once a week   Drug use: No     Allergies   Patient has no known allergies.   Review of Systems Review of Systems See HPI  Physical Exam Triage Vital Signs ED Triage Vitals  Enc Vitals Group     BP 12/12/22 1555 114/75     Pulse Rate 12/12/22 1555 87     Resp 12/12/22 1555 14     Temp 12/12/22 1555 98.5 F (36.9 C)     Temp Source 12/12/22 1555 Oral  SpO2 12/12/22 1555 98 %     Weight 12/12/22 1556 205 lb (93 kg)     Height 12/12/22 1556 6' (1.829 m)     Head Circumference --      Peak Flow --      Pain Score 12/12/22 1556 4     Pain Loc --      Pain Edu? --      Excl. in [place]? --    No data found.  Updated Vital Signs BP 114/75 (BP Location: Left Arm)   Pulse 87   Temp 98.5 F (36.9 C) (Oral)   Resp 14   Ht 6' (1.829 m)   Wt 93 kg   SpO2 98%   BMI 27.80 kg/m      Physical Exam Constitutional:      General: He is not in acute distress.    Appearance: He is well-developed and normal weight. He is ill-appearing.  HENT:     Head: Normocephalic and atraumatic.     Right Ear: Tympanic membrane and ear canal normal.     Left Ear: Tympanic membrane and ear canal normal.     Nose: Congestion and rhinorrhea present.     Mouth/Throat:     Pharynx: Posterior oropharyngeal  erythema present.  Eyes:     Conjunctiva/sclera: Conjunctivae normal.     Pupils: Pupils are equal, round, and reactive to light.  Cardiovascular:     Rate and Rhythm: Normal rate and regular rhythm.     Heart sounds: Normal heart sounds.  Pulmonary:     Effort: Pulmonary effort is normal. No respiratory distress.     Breath sounds: Wheezing and rhonchi present.     Comments: Patient has inspiratory wheezes through both lung fields and some scattered rhonchi Abdominal:     General: There is no distension.     Palpations: Abdomen is soft.  Musculoskeletal:        General: Normal range of motion.     Cervical back: Normal range of motion and neck supple.  Lymphadenopathy:     Cervical: No cervical adenopathy.  Skin:    General: Skin is warm and dry.  Neurological:     Mental Status: He is alert.     Gait: Gait normal.      UC Treatments / Results  Labs (all labs ordered are listed, but only abnormal results are displayed) Labs Reviewed - No data to display  EKG   Radiology No results found.  Procedures Procedures (including critical care time)  Medications Ordered in UC Medications - No data to display  Initial Impression / Assessment and Plan / UC Course  I have reviewed the triage vital signs and the nursing notes.  Pertinent labs & imaging results that were available during my care of the patient were reviewed by me and considered in my medical decision making (see chart for details).     Final Clinical Impressions(s) / UC Diagnoses   Final diagnoses:  Acute bronchitis, unspecified organism  Wheezing  Allergy, initial encounter     Discharge Instructions      Make sure you are drinking plenty of fluids Run a humidifier in the bedroom if you can Take the Augmentin 2 times a day for a week I recommend a probiotic while on Augmentin Take prednisone once a day as directed Call for problems   ED Prescriptions     Medication Sig Dispense Auth.  Provider   amoxicillin-clavulanate (AUGMENTIN) 875-125 MG tablet Take 1 tablet by  mouth every 12 (twelve) hours. 14 tablet Raylene Everts, MD   predniSONE (DELTASONE) 20 MG tablet Take 2 pills once a day for 5 days, then 1 pill once a day for 5 days, then discontinue 15 tablet Meda Coffee Jennette Banker, MD      PDMP not reviewed this encounter.   Raylene Everts, MD 12/12/22 [phone]

## 2022-12-12 NOTE — Discharge Instructions (Addendum)
Make sure you are drinking plenty of fluids Run a humidifier in the bedroom if you can Take the Augmentin 2 times a day for a week I recommend a probiotic while on Augmentin Take prednisone once a day as directed Call for problems

## 2022-12-15 ENCOUNTER — Ambulatory Visit
Admission: EM | Admit: 2022-12-15 | Discharge: 2022-12-15 | Disposition: A | Discharge status: Home / Self Care | Payer: BC Managed Care – PPO | Attending: Family Medicine | Admitting: Family Medicine

## 2022-12-15 DIAGNOSIS — R051 Acute cough: Secondary | ICD-10-CM

## 2022-12-15 MED ORDER — ALBUTEROL SULFATE HFA 108 (90 BASE) MCG/ACT IN AERS
2.0000 | INHALATION_SPRAY | Freq: Once | RESPIRATORY_TRACT | Status: AC
  Administered 2022-12-15: 2 via RESPIRATORY_TRACT

## 2022-12-15 MED ORDER — PROMETHAZINE-DM 6.25-15 MG/5ML PO SYRP: 5.0000 mL | ORAL_SOLUTION | Freq: Four times a day (QID) | ORAL | 0 refills | Status: DC | PRN

## 2022-12-15 MED ORDER — BENZONATATE 200 MG PO CAPS: 200.0000 mg | ORAL_CAPSULE | Freq: Three times a day (TID) | ORAL | 0 refills | Status: DC | PRN

## 2022-12-15 NOTE — Discharge Instructions (Signed)
.    May take Tessalon 2-3 times a day.  This will not cause drowsiness.  This will help with your daytime coughing and when you are trying to work Take Phenergan DM at nighttime.  This will help you to sleep  Continue drinking lots of fluids. Finish your Augmentin.

## 2022-12-15 NOTE — ED Provider Notes (Signed)
John[facility] C[facility]    CSN: [id number] Arrival date & time: 12/15/22  1132      History   Chief Complaint Chief Complaint  Patient presents with   Cough    HPI Johnny Ramirez is a 52 y.o. male.   HPI  Patient is on Augmentin and prednisone for an upper respiratory infection and cough, presumed bronchitis.  He states that he has been taking the medications prescribed but the cough is still severe.  He keeps awake at night.  It is making him uncomfortable.  His chest is hurting with coughing.  He is taking Delsym as needed.  He is here for cough management.  States he feels he still wheezing in spite of the prednisone  History reviewed. No pertinent past medical history.  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 Authorizing Provider  benzonatate (TESSALON) 200 MG capsule Take 1 capsule (200 mg total) by mouth 3 (three) times daily as needed for cough. 12/15/22  Yes Raylene Everts, MD  promethazine-dextromethorphan (PROMETHAZINE-DM) 6.25-15 MG/5ML syrup Take 5 mLs by mouth 4 (four) times daily as needed for cough. 12/15/22  Yes Raylene Everts, MD  AMBULATORY NON FORMULARY MEDICATION Medication Name: CPAP set to 9 cm water pressure with humidifier and supplies. Please fit for mask.  Dx OSA. 11/27/18   Hali Marry, MD  amoxicillin-clavulanate (AUGMENTIN) 875-125 MG tablet Take 1 tablet by mouth every 12 (twelve) hours. 12/12/22   Raylene Everts, MD   fluticasone [facility]) 50 MCG/ACT nasal spray Place 2 sprays into both nostrils daily. 11/10/22   Hali Marry, MD  Multiple Vitamins-Minerals (MENS MULTI VITAMIN & MINERAL PO) Take 1 tablet by mouth daily.    [provider]  omeprazole (PRILOSEC) 20 MG capsule Take 1 capsule (20 mg total) by mouth daily. 11/10/22   Hali Marry, MD  predniSONE (DELTASONE) 20 MG tablet Take 2 pills once a day for 5 days, then 1 pill once a day for 5 days, then discontinue 12/12/22   Raylene Everts, MD  sildenafil (VIAGRA) 100 MG tablet Take 0.5-1 tablets (50-100 mg total) by mouth daily as needed for erectile dysfunction. 11/10/22   Hali Marry, MD    Family History Family History  Problem Relation Age of Onset   Healthy Mother    Cancer Father 58   Healthy Sister    Healthy Sister    Healthy Sister    Diabetes Other     Social History Social History   Tobacco Use   Smoking status: Never   Smokeless tobacco: Never  Vaping Use   Vaping Use: Never used  Substance Use Topics   Alcohol use: Yes    Alcohol/week: 1.0 standard drink of alcohol    Types: 1 Standard drinks or equivalent per week    Comment: beer once a week   Drug use: No     Allergies  Patient has no known allergies.   Review of Systems Review of Systems See HPI  Physical Exam Triage Vital Signs ED Triage Vitals  Enc Vitals Group     BP 12/15/22 1138 128/78     Pulse Rate 12/15/22 1138 79     Resp 12/15/22 1138 16     Temp 12/15/22 1138 98.2 F (36.8 C)     Temp Source 12/15/22 1138 Oral     SpO2 12/15/22 1138 95 %     Weight --      Height --      Head Circumference --      Peak Flow --      Pain Score 12/15/22 1139 0     Pain Loc --      Pain Edu? --      Excl. in [place]? --    No data found.  Updated Vital Signs BP 128/78 (BP Location: Left Arm)   Pulse 79   Temp 98.2 F (36.8 C) (Oral)   Resp 16   SpO2 95%       Physical Exam Constitutional:      General: He  is not in acute distress.    Appearance: Normal appearance. He is well-developed.  HENT:     Head: Normocephalic and atraumatic.     Mouth/Throat:     Mouth: Mucous membranes are moist.  Eyes:     Conjunctiva/sclera: Conjunctivae normal.     Pupils: Pupils are equal, round, and reactive to light.  Cardiovascular:     Rate and Rhythm: Normal rate and regular rhythm.     Heart sounds: Normal heart sounds.  Pulmonary:     Effort: Pulmonary effort is normal. No respiratory distress.     Breath sounds: Wheezing and rhonchi present.     Comments: Few rhonchi and wheeze remain, centrally.  After albuterol treatment lungs are clear Abdominal:     General: There is no distension.     Palpations: Abdomen is soft.  Musculoskeletal:        General: Normal range of motion.     Cervical back: Normal range of motion and neck supple.  Skin:    General: Skin is warm and dry.  Neurological:     Mental Status: He is alert.      UC Treatments / Results  Labs (all labs ordered are listed, but only abnormal results are displayed) Labs Reviewed - No data to display  EKG   Radiology No results found.  Procedures Procedures (including critical care time)  Medications Ordered in UC Medications  albuterol (VENTOLIN HFA) 108 (90 Base) MCG/ACT inhaler 2 puff (2 puffs Inhalation Given 12/15/22 1158)    Initial Impression / Assessment and Plan / UC Course  I have reviewed the triage vital signs and the nursing notes.  Pertinent labs & imaging results that were available during my care of the patient were reviewed by me and considered in my medical decision making (see chart for details).     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  I think the infection is not as active as previously.  Discussed management. Final Clinical Impressions(s) / UC Diagnoses   Final diagnoses:  Acute cough     Discharge Instructions      .  May take Tessalon 2-3 times  a day.  This will not cause drowsiness.  This will help with your daytime coughing and when you are trying to work Take Phenergan  DM at nighttime.  This will help you to sleep  Continue drinking lots of fluids. Finish your Augmentin.    ED Prescriptions     Medication Sig Dispense Auth. Provider   promethazine-dextromethorphan (PROMETHAZINE-DM) 6.25-15 MG/5ML syrup Take 5 mLs by mouth 4 (four) times daily as needed for cough. 118 mL Raylene Everts, MD   benzonatate (TESSALON) 200 MG capsule Take 1 capsule (200 mg total) by mouth 3 (three) times daily as needed for cough. 21 capsule Raylene Everts, MD      PDMP not reviewed this encounter.   Raylene Everts, MD 12/15/22 [phone]

## 2022-12-15 NOTE — ED Triage Notes (Signed)
Pt c/o continued cough x 11 days. Was seen here in [place] on 3/31, tx with abx and prednisone. Still has several days left. Also taking Delsym prn. Cough worse at night.

## 2023-06-03 ENCOUNTER — Ambulatory Visit: Payer: BC Managed Care – PPO | Admitting: Family Medicine

## 2023-06-03 ENCOUNTER — Ambulatory Visit: Payer: BC Managed Care – PPO

## 2023-06-03 ENCOUNTER — Encounter: Payer: Self-pay | Admitting: Family Medicine

## 2023-06-03 VITALS — BP 124/70 | HR 85 | Ht 72.0 in | Wt 211.0 lb

## 2023-06-03 DIAGNOSIS — R42 Dizziness and giddiness: Secondary | ICD-10-CM

## 2023-06-03 DIAGNOSIS — R0602 Shortness of breath: Secondary | ICD-10-CM

## 2023-06-03 NOTE — Progress Notes (Signed)
Acute Office Visit  Subjective:     Patient ID: Johnny Ramirez, male    DOB: 03/28/1971, 52 y.o.   MRN: [id number]  Chief Complaint  Patient presents with   Shortness of Breath    HPI Patient is in today for SOB and dizziness he says the symptoms started about 4 to 5 weeks ago.  He just noticed that he is breathing hard even with minimal activities if he goes up a flight of stairs and he really feels short of breath it takes about 30 seconds to recover if he goes walking for an extended period can take him on as 10 minutes to recover he is also feeling like he is dizzy.  He denies a sensation of feeling like his got a pass out or room spinning he just feels like things are off just slightly he sometimes gets a tightness between his shoulder blades and in his back.  But no true chest pain.  No abdominal pain.  Moving bowels normally.  No recent upper respiratory infections or cough.  He denies any wheezing.  No recent rashes or tick bites.  No recent head injuries.  ROS      Objective:    BP 124/70   Pulse 85   Ht 6' (1.829 m)   Wt 211 lb (95.7 kg)   SpO2 100%   BMI 28.62 kg/m    Physical Exam Vitals and nursing note reviewed.  Constitutional:      Appearance: Normal appearance.  HENT:     Head: Normocephalic and atraumatic.     Right Ear: Tympanic membrane, ear canal and external ear normal. There is no impacted cerumen.     Left Ear: Tympanic membrane, ear canal and external ear normal. There is no impacted cerumen.     Nose: Nose normal.     Mouth/Throat:     Pharynx: Oropharynx is clear.  Eyes:     Conjunctiva/sclera: Conjunctivae normal.  Cardiovascular:     Rate and Rhythm: Normal rate and regular rhythm.  Pulmonary:     Effort: Pulmonary effort is normal.     Breath sounds: Normal breath sounds.  Abdominal:     General: Bowel sounds are normal.     Palpations: Abdomen is soft.     Tenderness: There is no abdominal tenderness.  Musculoskeletal:     Cervical  back: Neck supple. No tenderness.  Lymphadenopathy:     Cervical: No cervical adenopathy.  Skin:    General: Skin is warm and dry.  Neurological:     Mental Status: He is alert and oriented to person, place, and time.  Psychiatric:        Mood and Affect: Mood normal.     No results found for any visits on 06/03/23.      Assessment & Plan:   Problem List Items Addressed This Visit   None Visit Diagnoses     SOB (shortness of breath)    -  Primary   Relevant Orders   EKG 12-Lead   CMP14+EGFR   CBC with Differential/Platelet   TSH   Fe+TIBC+Fer   DG Chest 2 View (Completed)   Dizziness       Relevant Orders   EKG 12-Lead   CMP14+EGFR   CBC with Differential/Platelet   TSH   Fe+TIBC+Fer   DG Chest 2 View (Completed)       EKG shows NSR, no acute changes.  Rate of 72 bpm, no acute changes.  It is reassuring will  check labs to rule out deficiencies or electrolyte abnormality will check for anemia and thyroid disorder.  For shortness of breath we will go ahead and get chest x-ray today for further workup.  Consider referring for stress testing if not improving did discuss warning signs and symptoms for when to go to the ED over the weekend.  Vitals look great today blood pressures at goal.  No orders of the defined types were placed in this encounter.   No follow-ups on file.  Nani Gasser, MD

## 2023-06-03 NOTE — Progress Notes (Signed)
Hi Javin, CXR looks good.

## 2023-06-04 LAB — CBC WITH DIFFERENTIAL/PLATELET
Basophils Absolute: 0 10*3/uL (ref 0.0–0.2)
Basos: 1 %
EOS (ABSOLUTE): 0.1 10*3/uL (ref 0.0–0.4)
Eos: 3 %
Hematocrit: 41.2 % (ref 37.5–51.0)
Hemoglobin: 14 g/dL (ref 13.0–17.7)
Immature Grans (Abs): 0 10*3/uL (ref 0.0–0.1)
Immature Granulocytes: 0 %
Lymphocytes Absolute: 1.8 10*3/uL (ref 0.7–3.1)
Lymphs: 36 %
MCH: 32.1 pg (ref 26.6–33.0)
MCHC: 34 g/dL (ref 31.5–35.7)
MCV: 95 fL (ref 79–97)
Monocytes Absolute: 0.4 10*3/uL (ref 0.1–0.9)
Monocytes: 9 %
Neutrophils Absolute: 2.6 10*3/uL (ref 1.4–7.0)
Neutrophils: 51 %
Platelets: 279 10*3/uL (ref 150–450)
RBC: 4.36 x10E6/uL (ref 4.14–5.80)
RDW: 12.4 % (ref 11.6–15.4)
WBC: 5 10*3/uL (ref 3.4–10.8)

## 2023-06-04 LAB — IRON,TIBC AND FERRITIN PANEL
Ferritin: 225 ng/mL (ref 30–400)
Iron Saturation: 32 % (ref 15–55)
Iron: 110 ug/dL (ref 38–169)
Total Iron Binding Capacity: 341 ug/dL (ref 250–450)
UIBC: 231 ug/dL (ref 111–343)

## 2023-06-04 LAB — CMP14+EGFR
ALT: 12 IU/L (ref 0–44)
AST: 20 IU/L (ref 0–40)
Albumin: 4.7 g/dL (ref 3.8–4.9)
Alkaline Phosphatase: 72 IU/L (ref 44–121)
BUN/Creatinine Ratio: 10 (ref 9–20)
BUN: 11 mg/dL (ref 6–24)
Bilirubin Total: 0.5 mg/dL (ref 0.0–1.2)
CO2: 24 mmol/L (ref 20–29)
Calcium: 9.3 mg/dL (ref 8.7–10.2)
Chloride: 105 mmol/L (ref 96–106)
Creatinine, Ser: 1.14 mg/dL (ref 0.76–1.27)
Globulin, Total: 2.8 g/dL (ref 1.5–4.5)
Glucose: 99 mg/dL (ref 70–99)
Potassium: 4.1 mmol/L (ref 3.5–5.2)
Sodium: 141 mmol/L (ref 134–144)
Total Protein: 7.5 g/dL (ref 6.0–8.5)
eGFR: 78 mL/min/{1.73_m2} (ref >59)

## 2023-06-04 LAB — TSH: TSH: 1.42 u[IU]/mL (ref 0.450–4.500)

## 2023-06-06 ENCOUNTER — Encounter: Payer: Self-pay | Admitting: Family Medicine

## 2023-06-06 DIAGNOSIS — R0602 Shortness of breath: Secondary | ICD-10-CM

## 2023-06-06 DIAGNOSIS — R42 Dizziness and giddiness: Secondary | ICD-10-CM

## 2023-06-06 NOTE — Progress Notes (Signed)
HI Johnny Ramirez, your metabolic panel looks great.  Blood count is normal no sign of anemia or infection.  Thyroid level looks great as well.  Iron levels are normal so not causing her shortness of breath.  If you are still not feeling well and neck step would be to consider referral to cardiology for further workup.  If you are okay with that please let me know and I will make a referral.

## 2023-06-08 NOTE — Telephone Encounter (Signed)
Orders Placed This Encounter  Procedures   Ambulatory referral to Cardiology    Referral Priority:   Routine    Referral Type:   Consultation    Referral Reason:   Specialty Services Required    Number of Visits Requested:   1

## 2023-06-08 NOTE — Telephone Encounter (Signed)
Patient informed and already has been contacted by Cardiology for scheduling.

## 2023-07-04 DIAGNOSIS — R0602 Shortness of breath: Secondary | ICD-10-CM | POA: Insufficient documentation

## 2023-07-04 HISTORY — DX: Shortness of breath: R06.02

## 2023-07-05 ENCOUNTER — Ambulatory Visit: Payer: BC Managed Care – PPO | Attending: Cardiology | Admitting: Cardiology

## 2023-07-05 ENCOUNTER — Encounter: Payer: Self-pay | Admitting: Cardiology

## 2023-07-05 VITALS — BP 106/66 | HR 57 | Ht 72.0 in | Wt 206.1 lb

## 2023-07-05 DIAGNOSIS — R011 Cardiac murmur, unspecified: Secondary | ICD-10-CM | POA: Insufficient documentation

## 2023-07-05 DIAGNOSIS — R0602 Shortness of breath: Secondary | ICD-10-CM | POA: Diagnosis not present

## 2023-07-05 DIAGNOSIS — R000 Tachycardia, unspecified: Secondary | ICD-10-CM | POA: Diagnosis not present

## 2023-07-05 MED ORDER — ASPIRIN 81 MG PO TBEC
81.0000 mg | DELAYED_RELEASE_TABLET | Freq: Every day | ORAL | 3 refills | Status: DC
Start: 2023-07-05 — End: 2023-10-19

## 2023-07-05 MED ORDER — ASPIRIN 81 MG PO TBEC
81.0000 mg | DELAYED_RELEASE_TABLET | Freq: Every day | ORAL | Status: DC
Start: 2023-07-05 — End: 2023-07-05

## 2023-07-05 NOTE — Patient Instructions (Signed)
Medication Instructions:  Your physician has recommended you make the following change in your medication:   Start taking 81 mg coated aspirin daily.  *If you need a refill on your cardiac medications before your next appointment, please call your pharmacy*   Lab Work: Your physician recommends that you have a BMP, ProBNP and D-dimer today in the office.   If you have labs (blood work) drawn today and your tests are completely normal, you will receive your results only by: MyChart Message (if you have MyChart) OR A paper copy in the mail If you have any lab test that is abnormal or we need to change your treatment, we will call you to review the results.   Testing/Procedures:   Your cardiac CT will be scheduled at one of the below locations:   [facility] [address] [place], [place] [address] [phone]  If scheduled at [facility], please arrive at the [facility] and Children's Entrance (Entrance C2) of [facility] 30 minutes prior to test start time. You can use the FREE valet parking offered at entrance C (encouraged to control the heart rate for the test)  Proceed to the [facility] Radiology Department (first floor) to check-in and test prep.  All radiology patients and guests should use entrance C2 at [facility], accessed from [facility], even though the hospital's physical address listed is [address].     Please follow these instructions carefully (unless otherwise directed):  Hold all erectile dysfunction medications at least 3 days (72 hrs) prior to test. (Ie viagra, cialis, sildenafil, tadalafil, etc) We will administer nitroglycerin during this exam.   On the Night Before the Test: Be sure to Drink plenty of water. Do not consume any caffeinated/decaffeinated beverages or chocolate 12 hours prior to your test. Do not take any antihistamines 12 hours prior to your test.   On the Day of the  Test: Drink plenty of water until 1 hour prior to the test. Do not eat any food 1 hour prior to test. You may take your regular medications prior to the test.        After the Test: Drink plenty of water. After receiving IV contrast, you may experience a mild flushed feeling. This is normal. On occasion, you may experience a mild rash up to 24 hours after the test. This is not dangerous. If this occurs, you can take Benadryl 25 mg and increase your fluid intake. If you experience trouble breathing, this can be serious. If it is severe call 911 IMMEDIATELY. If it is mild, please call our office. If you take any of these medications: Glipizide/Metformin, Avandament, Glucavance, please do not take 48 hours after completing test unless otherwise instructed.  We will call to schedule your test 2-4 weeks out understanding that some insurance companies will need an authorization prior to the service being performed.   For non-scheduling related questions, please contact the cardiac imaging nurse navigator should you have any questions/concerns: Rockwell Alexandria, Cardiac Imaging Nurse Navigator Larey Brick, Cardiac Imaging Nurse Navigator [facility] Direct Office Dial: [phone]   For scheduling needs, including cancellations and rescheduling, please call [place], [phone].   Your physician has requested that you have an echocardiogram. Echocardiography is a painless test that uses sound waves to create images of your heart. It provides your doctor with information about the size and shape of your heart and how well your heart's chambers and valves  are working. This procedure takes approximately one hour. There are no restrictions for this procedure. Please do NOT wear cologne, perfume, aftershave, or lotions (deodorant is allowed). Please arrive 15 minutes prior to your appointment time.   Your next appointment:   9 month(s)  The format for your next  appointment:   In Person  Provider:   Belva Crome, MD   Other Instructions Cardiac CT Angiogram A cardiac CT angiogram is a procedure to look at the heart and the area around the heart. It may be done to help find the cause of chest pains or other symptoms of heart disease. During this procedure, a substance called contrast dye is injected into the blood vessels in the area to be checked. A large X-ray machine, called a CT scanner, then takes detailed pictures of the heart and the surrounding area. The procedure is also sometimes called a coronary CT angiogram, coronary artery scanning, or CTA. A cardiac CT angiogram allows the health care provider to see how well blood is flowing to and from the heart. The health care provider will be able to see if there are any problems, such as: Blockage or narrowing of the coronary arteries in the heart. Fluid around the heart. Signs of weakness or disease in the muscles, valves, and tissues of the heart. Tell a health care provider about: Any allergies you have. This is especially important if you have had a previous allergic reaction to contrast dye. All medicines you are taking, including vitamins, herbs, eye drops, creams, and over-the-counter medicines. Any blood disorders you have. Any surgeries you have had. Any medical conditions you have. Whether you are pregnant or may be pregnant. Any anxiety disorders, chronic pain, or other conditions you have that may increase your stress or prevent you from lying still. What are the risks? Generally, this is a safe procedure. However, problems may occur, including: Bleeding. Infection. Allergic reactions to medicines or dyes. Damage to other structures or organs. Kidney damage from the contrast dye that is used. Increased risk of cancer from radiation exposure. This risk is low. Talk with your health care provider about: The risks and benefits of testing. How you can receive the lowest dose of  radiation. What happens before the procedure? Wear comfortable clothing and remove any jewelry, glasses, dentures, and hearing aids. Follow instructions from your health care provider about eating and drinking. This may include: For 12 hours before the procedure -- avoid caffeine. This includes tea, coffee, soda, energy drinks, and diet pills. Drink plenty of water or other fluids that do not have caffeine in them. Being well hydrated can prevent complications. For 4-6 hours before the procedure -- stop eating and drinking. The contrast dye can cause nausea, but this is less likely if your stomach is empty. Ask your health care provider about changing or stopping your regular medicines. This is especially important if you are taking diabetes medicines, blood thinners, or medicines to treat problems with erections (erectile dysfunction). What happens during the procedure?  Hair on your chest may need to be removed so that small sticky patches called electrodes can be placed on your chest. These will transmit information that helps to monitor your heart during the procedure. An IV will be inserted into one of your veins. You might be given a medicine to control your heart rate during the procedure. This will help to ensure that good images are obtained. You will be asked to lie on an exam table. This table will slide  in and out of the CT machine during the procedure. Contrast dye will be injected into the IV. You might feel warm, or you may get a metallic taste in your mouth. You will be given a medicine called nitroglycerin. This will relax or dilate the arteries in your heart. The table that you are lying on will move into the CT machine tunnel for the scan. The person running the machine will give you instructions while the scans are being done. You may be asked to: Keep your arms above your head. Hold your breath. Stay very still, even if the table is moving. When the scanning is complete, you  will be moved out of the machine. The IV will be removed. The procedure may vary among health care providers and hospitals. What can I expect after the procedure? After your procedure, it is common to have: A metallic taste in your mouth from the contrast dye. A feeling of warmth. A headache from the nitroglycerin. Follow these instructions at home: Take over-the-counter and prescription medicines only as told by your health care provider. If you are told, drink enough fluid to keep your urine pale yellow. This will help to flush the contrast dye out of your body. Most people can return to their normal activities right after the procedure. Ask your health care provider what activities are safe for you. It is up to you to get the results of your procedure. Ask your health care provider, or the department that is doing the procedure, when your results will be ready. Keep all follow-up visits as told by your health care provider. This is important. Contact a health care provider if: You have any symptoms of allergy to the contrast dye. These include: Shortness of breath. Rash or hives. A racing heartbeat. Summary A cardiac CT angiogram is a procedure to look at the heart and the area around the heart. It may be done to help find the cause of chest pains or other symptoms of heart disease. During this procedure, a large X-ray machine, called a CT scanner, takes detailed pictures of the heart and the surrounding area after a contrast dye has been injected into blood vessels in the area. Ask your health care provider about changing or stopping your regular medicines before the procedure. This is especially important if you are taking diabetes medicines, blood thinners, or medicines to treat erectile dysfunction. If you are told, drink enough fluid to keep your urine pale yellow. This will help to flush the contrast dye out of your body. This information is not intended to replace advice given to  you by your health care provider. Make sure you discuss any questions you have with your health care provider. Document Revised: 04/25/2019 Document Reviewed: 04/25/2019 Elsevier Patient Education  [employer].  Echocardiogram An echocardiogram is a test that uses sound waves to make images of your heart. This way of making images is often called ultrasound. The images from this test can help find out many things about your heart, including: The size and shape of your heart. The strength of your heart muscle and how well it's working. The size, thickness, and movement of your heart's walls. How your heart valves are working. Problems such as: A tumor or a growth from an infection around the heart valves. Areas of heart muscle that aren't working well because of poor blood flow or injury from a heart attack. An aneurysm. This is a weak or damaged part of an artery wall. An  artery is a blood vessel. Tell a health care provider about: Any allergies you have. All medicines you're taking, including vitamins, herbs, eye drops, creams, and over-the-counter medicines. Any bleeding problems you have. Any surgeries you've had. Any medical problems you have. Whether you're pregnant or may be pregnant. What are the risks? Your health care provider will talk with you about risks. These may include an allergic reaction to IV dye that may be used during the test. What happens before the test? You don't need to do anything to get ready for this test. You may eat and drink normally. What happens during the test?  You'll take off your clothes from the waist up and put on a hospital gown. Sticky patches called electrodes may be placed on your chest. These will be connected to a machine that monitors your heart rate and rhythm. You'll lie down on a table for the exam. A wand covered in gel will be moved over your chest. Sound waves from the wand will go to your heart and bounce back--or "echo"  back. The sound waves will go to a computer that uses them to make images of your heart. The images can be viewed on a monitor. The images will also be recorded on the computer so your provider can look at them later. You may be asked to change positions or hold your breath for a short time. This makes it easier to get different views or better views of your heart. In some cases, you may be given a dye through an IV. The IV is put into one of your veins. This dye can make the areas of your heart easier to see. The procedure may vary among providers and hospitals. What can I expect after the test? You may return to your normal diet, activities, and medicines unless your provider tells you not to. If an IV was placed for the test, it will be removed. It's up to you to get the results of your test. Ask your provider, or the department that's doing the test, when your results will be ready. This information is not intended to replace advice given to you by your health care provider. Make sure you discuss any questions you have with your health care provider. Document Revised: 10/29/2022 Document Reviewed: 10/29/2022 Elsevier Patient Education  2024 [employer].

## 2023-07-05 NOTE — Progress Notes (Signed)
Cardiology Office Note:    Date:  07/05/2023   ID:  Johnny Ramirez, DOB Dec 27, 1970, MRN [id number]  PCP:  Agapito Games, MD  Cardiologist:  Garwin Brothers, MD   Referring MD: Agapito Games, *    ASSESSMENT:    1. Tachycardia   2. Shortness of breath   3. Cardiac murmur    PLAN:    In order of problems listed above:  Primary prevention stressed with the patient.  Importance of compliance with diet medication stressed and patient verbalized standing. Dyspnea on exertion: Appears to be concerning.  I will do a BMP, BNP and a D-dimer today.  I would consider ruling out pulmonary thromboembolism.  We ordered a stat D-dimer today.  If this is negative I would like to rule out ischemic substrate with the CT coronary angiography and he is agreeable.  Patient was advised to take a coated baby aspirin on a daily basis from now on.  These tests are done. Cardiac murmur: Echocardiogram will be done to assess murmur heard on auscultation. Further recommendations will be made based on the findings of the aforementioned test. Patient will be seen in follow-up appointment in 6 months or earlier if the patient has any concerns.    Medication Adjustments/Labs and Tests Ordered: Current medicines are reviewed at length with the patient today.  Concerns regarding medicines are outlined above.  Orders Placed This Encounter  Procedures   CT CORONARY MORPH W/CTA COR W/SCORE W/CA W/CM &/OR WO/CM   Basic metabolic panel   Pro b natriuretic peptide (BNP)   D-Dimer, Quantitative   EKG 12-Lead   ECHOCARDIOGRAM COMPLETE   Meds ordered this encounter  Medications   DISCONTD: aspirin EC 81 MG tablet    Sig: Take 1 tablet (81 mg total) by mouth daily. Swallow whole.   aspirin EC 81 MG tablet    Sig: Take 1 tablet (81 mg total) by mouth daily. Swallow whole.    Dispense:  90 tablet    Refill:  3     History of Present Illness:    Johnny Ramirez is a 52 y.o. male who is being  seen today for the evaluation of dyspnea on exertion at the request of Agapito Games, *.  Patient is a pleasant 52 year old male.  He has no medical history of essential hypertension or dyslipidemia.  He mentions to me that he has family history of coronary artery disease.  Patient mentions to me that he has been noticing shortness of breath over the past 2 to 3 months more so.  When he exerts he gets out of breath.  No orthopnea or PND.  No chest pain.  At the time of my evaluation, the patient is alert awake oriented and in no distress.  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       Past Surgical History:  Procedure Laterality Date   APPENDECTOMY  as a child   HERNIA REPAIR     as a child   INGUINAL HERNIA REPAIR  2011    Current Medications: Current Meds  Medication Sig   AMBULATORY NON FORMULARY MEDICATION Medication Name: CPAP set to 9 cm water pressure with humidifier and supplies. Please fit for mask.  Dx OSA.   fluticasone (FLONASE) 50 MCG/ACT nasal spray Place 2 sprays into both nostrils daily.   Multiple Vitamins-Minerals (MENS MULTI VITAMIN & MINERAL PO) Take 1 tablet by mouth daily.   omeprazole (PRILOSEC) 20 MG capsule Take 1 capsule (20 mg total) by mouth daily.   sildenafil (VIAGRA) 100 MG tablet Take 0.5-1 tablets (50-100 mg total) by mouth daily as needed for erectile dysfunction.   [DISCONTINUED] aspirin EC 81 MG tablet Take 1 tablet (81 mg total)  by mouth daily. Swallow whole.     Allergies:   Patient has no known allergies.   Social History   Socioeconomic History   Marital status: Single    Spouse name: Not on file   Number of children: 2   Years of education: Not on file   Highest education level: Not on file  Occupational History   Not on file  Tobacco Use   Smoking status: Never   Smokeless tobacco: Never  Vaping Use   Vaping status: Never Used  Substance and Sexual Activity   Alcohol use: Yes    Alcohol/week: 1.0 standard drink of alcohol    Types: 1 Standard drinks or equivalent per week    Comment: beer once a week   Drug use: No   Sexual activity: Yes  Other Topics Concern   Not on file  Social History Narrative   No regular exercise.  1 cup of coffee a day    Social Determinants of Health   Financial Resource Strain: Not on file  Food Insecurity: Not on file  Transportation Needs: Not on file  Physical Activity: Not on file  Stress: Not on file  Social Connections: Unknown (01/15/2022)   Received from [facility]   Social Network    Social Network: Not on file     Family History: The patient's family history includes Cancer (age of onset: 60) in his father; Diabetes in an other family member; Healthy in his mother, sister, sister, and sister.  ROS:   Please see the history of present illness.    All other systems reviewed and are negative.  EKGs/Labs/Other Studies Reviewed:    The following studies were reviewed today: .Marland KitchenEKG Interpretation Date/Time:  Tuesday July 05 2023 09:23:47 EDT Ventricular Rate:  57 PR Interval:  138 QRS Duration:  96 QT Interval:  396 QTC Calculation: 385 R Axis:   64  Text Interpretation: Sinus bradycardia When compared with ECG of 28-Oct-2020 08:20, Significant changes have occurred Confirmed by Belva Crome [phone]) on 07/05/2023 10:16:52 AM         Recent Labs: 06/03/2023: ALT 12; BUN 11; Creatinine, Ser 1.14; Hemoglobin 14.0;  Platelets 279; Potassium 4.1; Sodium 141; TSH 1.420  Recent Lipid Panel    Component Value Date/Time   CHOL 180 11/10/2022 1035   TRIG 43 11/10/2022 1035   HDL 73 11/10/2022 1035   CHOLHDL 2.5 11/10/2022 1035   VLDL 9 09/19/2015 0934   LDLCALC 94 11/10/2022 1035    Physical Exam:    VS:  BP 106/66   Pulse (!) 57   Ht 6' (1.829 m)   Wt 206 lb 1.3 oz (93.5 kg)  SpO2 99%   BMI 27.95 kg/m     Wt Readings from Last 3 Encounters:  07/05/23 206 lb 1.3 oz (93.5 kg)  06/03/23 211 lb (95.7 kg)  12/12/22 205 lb (93 kg)     GEN: Patient is in no acute distress HEENT: Normal NECK: No JVD; No carotid bruits LYMPHATICS: No lymphadenopathy CARDIAC: S1 S2 regular, 2/6 systolic murmur at the apex. RESPIRATORY:  Clear to auscultation without rales, wheezing or rhonchi  ABDOMEN: Soft, non-tender, non-distended MUSCULOSKELETAL:  No edema; No deformity  SKIN: Warm and dry NEUROLOGIC:  Alert and oriented x 3 PSYCHIATRIC:  Normal affect    Signed, Garwin Brothers, MD  07/05/2023 10:16 AM    [facility]

## 2023-07-06 LAB — BASIC METABOLIC PANEL
BUN/Creatinine Ratio: 9 (ref 9–20)
BUN: 11 mg/dL (ref 6–24)
CO2: 24 mmol/L (ref 20–29)
Calcium: 9.9 mg/dL (ref 8.7–10.2)
Chloride: 106 mmol/L (ref 96–106)
Creatinine, Ser: 1.18 mg/dL (ref 0.76–1.27)
Glucose: 101 mg/dL — ABNORMAL HIGH (ref 70–99)
Potassium: 5.1 mmol/L (ref 3.5–5.2)
Sodium: 143 mmol/L (ref 134–144)
eGFR: 75 mL/min/{1.73_m2} (ref >59)

## 2023-07-06 LAB — PRO B NATRIURETIC PEPTIDE: NT-Pro BNP: <36 pg/mL (ref 0–121)

## 2023-07-06 LAB — D-DIMER, QUANTITATIVE: D-DIMER: 0.41 mg{FEU}/L (ref 0.00–0.49)

## 2023-07-18 ENCOUNTER — Encounter (HOSPITAL_COMMUNITY): Payer: Self-pay

## 2023-07-20 ENCOUNTER — Ambulatory Visit (HOSPITAL_COMMUNITY)
Admission: RE | Admit: 2023-07-20 | Discharge: 2023-07-20 | Disposition: A | Discharge status: Home / Self Care | Payer: BC Managed Care – PPO | Source: Ambulatory Visit | Attending: Cardiology | Admitting: Cardiology

## 2023-07-20 DIAGNOSIS — R011 Cardiac murmur, unspecified: Secondary | ICD-10-CM

## 2023-07-20 DIAGNOSIS — R0609 Other forms of dyspnea: Secondary | ICD-10-CM

## 2023-07-20 DIAGNOSIS — R0602 Shortness of breath: Secondary | ICD-10-CM | POA: Diagnosis not present

## 2023-07-20 MED ORDER — IOHEXOL 350 MG/ML SOLN
100.0000 mL | Freq: Once | INTRAVENOUS | Status: AC | PRN
Start: 2023-07-20 — End: 2023-07-20
  Administered 2023-07-20: 100 mL via INTRAVENOUS

## 2023-07-20 MED ORDER — NITROGLYCERIN 0.4 MG SL SUBL
0.8000 mg | SUBLINGUAL_TABLET | Freq: Once | SUBLINGUAL | Status: AC
  Administered 2023-07-20: 0.8 mg via SUBLINGUAL

## 2023-07-20 MED ORDER — NITROGLYCERIN 0.4 MG SL SUBL
SUBLINGUAL_TABLET | SUBLINGUAL | Status: AC
Start: 2023-07-20 — End: ?
  Filled 2023-07-20: qty 2

## 2023-07-20 MED ORDER — METOPROLOL TARTRATE 5 MG/5ML IV SOLN
10.0000 mg | Freq: Once | INTRAVENOUS | Status: AC
Start: 2023-07-20 — End: 2023-07-20
  Administered 2023-07-20: 10 mg via INTRAVENOUS

## 2023-07-20 MED ORDER — METOPROLOL TARTRATE 5 MG/5ML IV SOLN
INTRAVENOUS | Status: AC
  Filled 2023-07-20: qty 10

## 2023-07-28 ENCOUNTER — Ambulatory Visit (HOSPITAL_BASED_OUTPATIENT_CLINIC_OR_DEPARTMENT_OTHER): Payer: BC Managed Care – PPO

## 2023-09-27 ENCOUNTER — Telehealth: Payer: Self-pay

## 2023-09-27 DIAGNOSIS — Z0000 Encounter for general adult medical examination without abnormal findings: Secondary | ICD-10-CM

## 2023-09-27 NOTE — Telephone Encounter (Signed)
 Copied from CRM [id number]. Topic: Clinical - Request for Lab/Test Order >> Sep 26, 2023 12:04 PM Johnny Ramirez wrote: Reason for CRM: Patient called to schedule annual physical with fasting labs. Made appt for  2/5. Not showing order for labs. Thank You

## 2023-09-27 NOTE — Telephone Encounter (Signed)
 Fasting labs ordered for provider's review.

## 2023-09-27 NOTE — Telephone Encounter (Signed)
 Orders Placed This Encounter  Procedures   Comprehensive Metabolic Panel (CMET)   Lipid panel   CBC

## 2023-10-19 ENCOUNTER — Encounter: Payer: Self-pay | Admitting: Family Medicine

## 2023-10-19 ENCOUNTER — Ambulatory Visit (INDEPENDENT_AMBULATORY_CARE_PROVIDER_SITE_OTHER): Payer: BC Managed Care – PPO | Admitting: Family Medicine

## 2023-10-19 VITALS — BP 110/64 | HR 74 | Ht 72.0 in | Wt 218.0 lb

## 2023-10-19 DIAGNOSIS — Z0000 Encounter for general adult medical examination without abnormal findings: Secondary | ICD-10-CM | POA: Diagnosis not present

## 2023-10-19 DIAGNOSIS — K209 Esophagitis, unspecified without bleeding: Secondary | ICD-10-CM

## 2023-10-19 DIAGNOSIS — Z125 Encounter for screening for malignant neoplasm of prostate: Secondary | ICD-10-CM

## 2023-10-19 MED ORDER — OMEPRAZOLE 20 MG PO CPDR: 20.0000 mg | DELAYED_RELEASE_CAPSULE | Freq: Every day | ORAL | 3 refills | Status: AC

## 2023-10-19 MED ORDER — SILDENAFIL CITRATE 100 MG PO TABS: 50.0000 mg | ORAL_TABLET | Freq: Every day | ORAL | 11 refills | Status: AC | PRN

## 2023-10-19 MED ORDER — FLUTICASONE PROPIONATE 50 MCG/ACT NA SUSP: 2.0000 | Freq: Every day | NASAL | 5 refills | Status: AC

## 2023-10-19 NOTE — Progress Notes (Deleted)
   Established Patient Office Visit  Subjective  Patient ID: Johnny Ramirez, male    DOB: 1970/11/08  Age: 53 y.o. MRN: [id number]  Chief Complaint  Patient presents with   Annual Exam    HPI  {History (Optional):23778}  ROS    Objective:     BP 110/64   Pulse 74   Ht 6' (1.829 m)   Wt 218 lb (98.9 kg)   SpO2 98%   BMI 29.57 kg/m  {Vitals History (Optional):23777}  Physical Exam Constitutional:      Appearance: Normal appearance.  HENT:     Head: Normocephalic and atraumatic.     Right Ear: Tympanic membrane, ear canal and external ear normal.     Left Ear: Tympanic membrane, ear canal and external ear normal.     Nose: Nose normal.     Mouth/Throat:     Pharynx: Oropharynx is clear.  Eyes:     Extraocular Movements: Extraocular movements intact.     Conjunctiva/sclera: Conjunctivae normal.     Pupils: Pupils are equal, round, and reactive to light.  Neck:     Thyroid : No thyromegaly.  Cardiovascular:     Rate and Rhythm: Normal rate and regular rhythm.  Pulmonary:     Effort: Pulmonary effort is normal.     Breath sounds: Normal breath sounds.  Abdominal:     General: Bowel sounds are normal.     Palpations: Abdomen is soft.     Tenderness: There is no abdominal tenderness.  Musculoskeletal:        General: No swelling.     Cervical back: Neck supple.  Skin:    General: Skin is warm and dry.  Neurological:     Mental Status: He is oriented to person, place, and time.  Psychiatric:        Mood and Affect: Mood normal.        Behavior: Behavior normal.      No results found for any visits on 10/19/23.  {Labs (Optional):23779}  The 10-year ASCVD risk score (Arnett DK, et al., 2019) is: 1.9%    Assessment & Plan:   Problem List Items Addressed This Visit   None Visit Diagnoses       Wellness examination    -  Primary   Relevant Orders   CMP14+EGFR   Lipid Panel With LDL/HDL Ratio   CBC   PSA     Screening for malignant neoplasm of  prostate       Relevant Orders   PSA       Return in about 1 year (around 10/18/2024) for Wellness Exam.    Dorothyann Byars, MD

## 2023-10-19 NOTE — Progress Notes (Signed)
 Complete physical exam  Patient: Johnny Ramirez   DOB: November 26, 1970   53 y.o. Male  MRN: [id number]  Subjective:    Chief Complaint  Patient presents with   Annual Exam    Johnny Ramirez is a 53 y.o. male who presents today for a complete physical exam. He reports consuming a general diet.  Physically active job.  He generally feels well.  He does have additional problems to discuss today.    Most recent fall risk assessment:    06/03/2023    2:01 PM  Fall Risk   Falls in the past year? 0  Number falls in past yr: 0  Injury with Fall? 0  Risk for fall due to : No Fall Risks  Follow up Falls evaluation completed     Most recent depression screenings:    06/03/2023    2:01 PM 11/10/2022    9:58 AM  PHQ 2/9 Scores  PHQ - 2 Score 0 0         Patient Care Team: Alvan Dorothyann BIRCH, MD as PCP - General (Family Medicine) Amelie Eastern, MD as Referring Physician (Gastroenterology)   Outpatient Medications Prior to Visit  Medication Sig   AMBULATORY NON FORMULARY MEDICATION Medication Name: CPAP set to 9 cm water pressure with humidifier and supplies. Please fit for mask.  Dx OSA.   Multiple Vitamins-Minerals (MENS MULTI VITAMIN & MINERAL PO) Take 1 tablet by mouth daily.   [DISCONTINUED] fluticasone  (FLONASE ) 50 MCG/ACT nasal spray Place 2 sprays into both nostrils daily.   [DISCONTINUED] omeprazole  (PRILOSEC) 20 MG capsule Take 1 capsule (20 mg total) by mouth daily.   [DISCONTINUED] sildenafil  (VIAGRA ) 100 MG tablet Take 0.5-1 tablets (50-100 mg total) by mouth daily as needed for erectile dysfunction.   [DISCONTINUED] aspirin  EC 81 MG tablet Take 1 tablet (81 mg total) by mouth daily. Swallow whole.   No facility-administered medications prior to visit.    ROS        Objective:     BP 110/64   Pulse 74   Ht 6' (1.829 m)   Wt 218 lb (98.9 kg)   SpO2 98%   BMI 29.57 kg/m     Physical Exam Vitals and nursing note reviewed.  Constitutional:       Appearance: Normal appearance.  HENT:     Head: Normocephalic and atraumatic.     Right Ear: Tympanic membrane, ear canal and external ear normal.     Left Ear: Tympanic membrane, ear canal and external ear normal.     Nose: Nose normal.     Mouth/Throat:     Pharynx: Oropharynx is clear.  Eyes:     Extraocular Movements: Extraocular movements intact.     Conjunctiva/sclera: Conjunctivae normal.     Pupils: Pupils are equal, round, and reactive to light.  Neck:     Thyroid : No thyromegaly.  Cardiovascular:     Rate and Rhythm: Normal rate and regular rhythm.  Pulmonary:     Effort: Pulmonary effort is normal.     Breath sounds: Normal breath sounds.  Abdominal:     General: Bowel sounds are normal.     Palpations: Abdomen is soft.     Tenderness: There is no abdominal tenderness.  Musculoskeletal:        General: No swelling.     Cervical back: Neck supple.  Skin:    General: Skin is warm and dry.  Neurological:     Mental Status: He is alert and oriented  to person, place, and time.  Psychiatric:        Mood and Affect: Mood normal.        Behavior: Behavior normal.      No results found for any visits on 10/19/23.      Assessment & Plan:    Routine Health Maintenance and Physical Exam  Immunization History  Administered Date(s) Administered   Influenza,inj,Quad PF,6+ Mos 09/22/2018   Tdap 07/23/2013, 08/22/2019    Health Maintenance  Topic Date Due   Hepatitis C Screening  11/11/2023 (Originally 09/22/1989)   INFLUENZA VACCINE  12/12/2023 (Originally 04/14/2023)   Zoster Vaccines- Shingrix (1 of 2) 02/08/2024 (Originally 09/22/2021)   COVID-19 Vaccine (1 - 2024-25 season) 06/18/2024 (Originally 05/15/2023)   HIV Screening  07/26/2024 (Originally 09/22/1986)   DTaP/Tdap/Td (3 - Td or Tdap) 08/21/2029   Colonoscopy  02/12/2032   HPV VACCINES  Aged Out    Discussed health benefits of physical activity, and encouraged him to engage in regular exercise appropriate  for his age and condition.  Problem List Items Addressed This Visit       Digestive   Acute esophagitis   Relevant Medications   omeprazole  (PRILOSEC) 20 MG capsule   Other Visit Diagnoses       Wellness examination    -  Primary   Relevant Orders   CMP14+EGFR   Lipid Panel With LDL/HDL Ratio   CBC   PSA     Screening for malignant neoplasm of prostate       Relevant Orders   PSA      Keep up a regular exercise program and make sure you are eating a healthy diet Try to eat 4 servings of dairy a day, or if you are lactose intolerant take a calcium with vitamin D daily.  Your vaccines are up to date.    Return in about 1 year (around 10/18/2024) for Wellness Exam.     Dorothyann Byars, MD

## 2023-10-20 LAB — CMP14+EGFR
ALT: 16 [IU]/L (ref 0–44)
AST: 21 [IU]/L (ref 0–40)
Albumin: 4.4 g/dL (ref 3.8–4.9)
Alkaline Phosphatase: 70 [IU]/L (ref 44–121)
BUN/Creatinine Ratio: 11 (ref 9–20)
BUN: 13 mg/dL (ref 6–24)
Bilirubin Total: 0.6 mg/dL (ref 0.0–1.2)
CO2: 24 mmol/L (ref 20–29)
Calcium: 9.2 mg/dL (ref 8.7–10.2)
Chloride: 105 mmol/L (ref 96–106)
Creatinine, Ser: 1.16 mg/dL (ref 0.76–1.27)
Globulin, Total: 2.5 g/dL (ref 1.5–4.5)
Glucose: 95 mg/dL (ref 70–99)
Potassium: 4.3 mmol/L (ref 3.5–5.2)
Sodium: 142 mmol/L (ref 134–144)
Total Protein: 6.9 g/dL (ref 6.0–8.5)
eGFR: 76 mL/min/{1.73_m2} (ref >59)

## 2023-10-20 LAB — CBC
Hematocrit: 40.4 % (ref 37.5–51.0)
Hemoglobin: 13.5 g/dL (ref 13.0–17.7)
MCH: 31.1 pg (ref 26.6–33.0)
MCHC: 33.4 g/dL (ref 31.5–35.7)
MCV: 93 fL (ref 79–97)
Platelets: 250 10*3/uL (ref 150–450)
RBC: 4.34 x10E6/uL (ref 4.14–5.80)
RDW: 12.3 % (ref 11.6–15.4)
WBC: 6 10*3/uL (ref 3.4–10.8)

## 2023-10-20 LAB — PSA: Prostate Specific Ag, Serum: 1.4 ng/mL (ref 0.0–4.0)

## 2023-10-20 LAB — LIPID PANEL WITH LDL/HDL RATIO
Cholesterol, Total: 167 mg/dL (ref 100–199)
HDL: 59 mg/dL (ref >39)
LDL Chol Calc (NIH): 99 mg/dL (ref 0–99)
LDL/HDL Ratio: 1.7 {ratio} (ref 0.0–3.6)
Triglycerides: 44 mg/dL (ref 0–149)
VLDL Cholesterol Cal: 9 mg/dL (ref 5–40)

## 2023-10-21 ENCOUNTER — Encounter: Payer: Self-pay | Admitting: Family Medicine

## 2023-10-21 NOTE — Progress Notes (Signed)
 Your lab work is within acceptable range and there are no concerning findings.   ?

## 2023-11-08 ENCOUNTER — Telehealth: Payer: Self-pay | Admitting: Family Medicine

## 2023-11-08 NOTE — Telephone Encounter (Signed)
 Copied from CRM [id number]. Topic: General - Other >> Nov 08, 2023 12:07 PM Fuller Mandril wrote: Reason for CRM: Patient called states he came in on 2/5 and brought form that needed to be completed for his insurance and has not received an update. Checking states or ETA of form completion. Thank You

## 2023-11-15 NOTE — Telephone Encounter (Signed)
 Form placed on Johnny L. Keyboard.

## 2024-07-27 ENCOUNTER — Telehealth: Admitting: Physician Assistant

## 2024-07-27 DIAGNOSIS — B9689 Other specified bacterial agents as the cause of diseases classified elsewhere: Secondary | ICD-10-CM | POA: Diagnosis not present

## 2024-07-27 DIAGNOSIS — J019 Acute sinusitis, unspecified: Secondary | ICD-10-CM | POA: Diagnosis not present

## 2024-07-27 MED ORDER — AMOXICILLIN-POT CLAVULANATE 875-125 MG PO TABS: 1.0000 | ORAL_TABLET | Freq: Two times a day (BID) | ORAL | 0 refills | Status: AC

## 2024-07-27 NOTE — Progress Notes (Signed)
 Virtual Visit Consent   Johnny Ramirez, you are scheduled for a virtual visit with a [facility] provider today. Just as with appointments in the office, your consent must be obtained to participate. Your consent will be active for this visit and any virtual visit you may have with one of our providers in the next 365 days. If you have a MyChart account, a copy of this consent can be sent to you electronically.  As this is a virtual visit, video technology does not allow for your provider to perform a traditional examination. This may limit your provider's ability to fully assess your condition. If your provider identifies any concerns that need to be evaluated in person or the need to arrange testing (such as labs, EKG, etc.), we will make arrangements to do so. Although advances in technology are sophisticated, we cannot ensure that it will always work on either your end or our end. If the connection with a video visit is poor, the visit may have to be switched to a telephone visit. With either a video or telephone visit, we are not always able to ensure that we have a secure connection.  By engaging in this virtual visit, you consent to the provision of healthcare and authorize for your insurance to be billed (if applicable) for the services provided during this visit. Depending on your insurance coverage, you may receive a charge related to this service.  I need to obtain your verbal consent now. Are you willing to proceed with your visit today? Johnny Ramirez has provided verbal consent on 07/27/2024 for a virtual visit (video or telephone). Delon CHRISTELLA Dickinson, PA-C  Date: 07/27/2024 4:15 PM   Virtual Visit via Video Note   I, Delon CHRISTELLA Dickinson, connected with  Johnny Ramirez  ([id number], 03/08/1971) on 07/27/24 at  4:15 PM EST by a video-enabled telemedicine application and verified that I am speaking with the correct person using two identifiers.  Location: Patient: Virtual Visit  Location Patient: Home Provider: Virtual Visit Location Provider: Home Office   I discussed the limitations of evaluation and management by telemedicine and the availability of in person appointments. The patient expressed understanding and agreed to proceed.    History of Present Illness: Johnny Ramirez is a 53 y.o. who identifies as a male who was assigned male at birth, and is being seen today for sinus congestion.  HPI: Sinusitis This is a new problem. The current episode started in the past 7 days. The problem has been gradually worsening since onset. There has been no fever. The pain is moderate. Associated symptoms include congestion, coughing (from drainage), headaches and sinus pressure (left maxillary sinus tenderness and radiating into teeth and retro-orbital pain on the left most likely with spenoid sinus involvement). Pertinent negatives include no chills, diaphoresis, ear pain, shortness of breath or sore throat. (Post nasal drainage) Past treatments include acetaminophen (flonase ). The treatment provided no relief.     Problems:  Patient Active Problem List   Diagnosis Date Noted   Cardiac murmur 07/05/2023   Shortness of breath 07/04/2023   ED (erectile dysfunction) 10/19/2021   Tachycardia 10/28/2020   Acute esophagitis 03/28/2020   Upper airway cough syndrome 02/21/2020   OSA (obstructive sleep apnea) 11/27/2018   Varicocele 06/10/2014    Allergies: No Known Allergies Medications:  Current Outpatient Medications:    amoxicillin -clavulanate (AUGMENTIN ) 875-125 MG tablet, Take 1 tablet by mouth 2 (two) times daily., Disp: 14 tablet, Rfl: 0   AMBULATORY NON FORMULARY MEDICATION, Medication Name:  CPAP set to 9 cm water pressure with humidifier and supplies. Please fit for mask.  Dx OSA., Disp: 1 vial, Rfl: 0   fluticasone  (FLONASE ) 50 MCG/ACT nasal spray, Place 2 sprays into both nostrils daily., Disp: 16 g, Rfl: 5   Multiple Vitamins-Minerals (MENS MULTI VITAMIN & MINERAL  PO), Take 1 tablet by mouth daily., Disp: , Rfl:    omeprazole  (PRILOSEC) 20 MG capsule, Take 1 capsule (20 mg total) by mouth daily., Disp: 90 capsule, Rfl: 3   sildenafil  (VIAGRA ) 100 MG tablet, Take 0.5-1 tablets (50-100 mg total) by mouth daily as needed for erectile dysfunction., Disp: 10 tablet, Rfl: 11  Observations/Objective: Patient is well-developed, well-nourished in no acute distress.  Resting comfortably at home.  Head is normocephalic, atraumatic.  No labored breathing.  Speech is clear and coherent with logical content.  Patient is alert and oriented at baseline.    Assessment and Plan: 1. Acute bacterial sinusitis (Primary) - amoxicillin -clavulanate (AUGMENTIN ) 875-125 MG tablet; Take 1 tablet by mouth 2 (two) times daily.  Dispense: 14 tablet; Refill: 0  - Worsening symptoms that have not responded to OTC medications.  - Will give Augmentin  - Continue allergy medications.  - Steam and humidifier can help - Stay well hydrated and get plenty of rest.  - Seek in person evaluation if no symptom improvement or if symptoms worsen   Follow Up Instructions: I discussed the assessment and treatment plan with the patient. The patient was provided an opportunity to ask questions and all were answered. The patient agreed with the plan and demonstrated an understanding of the instructions.  A copy of instructions were sent to the patient via MyChart unless otherwise noted below.    The patient was advised to call back or seek an in-person evaluation if the symptoms worsen or if the condition fails to improve as anticipated.    Delon CHRISTELLA Dickinson, PA-C

## 2024-07-27 NOTE — Patient Instructions (Signed)
 Johnny Ramirez, thank you for joining Delon CHRISTELLA Dickinson, PA-C for today's virtual visit.  While this provider is not your primary care provider (PCP), if your PCP is located in our provider database this encounter information will be shared with them immediately following your visit.   A [facility] MyChart account gives you access to today's visit and all your visits, tests, and labs performed at [facility]  click here if you don't have a [facility] MyChart account or go to [email or web address]  Consent: (Patient) Johnny Ramirez provided verbal consent for this virtual visit at the beginning of the encounter.  Current Medications:  Current Outpatient Medications:    amoxicillin -clavulanate (AUGMENTIN ) 875-125 MG tablet, Take 1 tablet by mouth 2 (two) times daily., Disp: 14 tablet, Rfl: 0   AMBULATORY NON FORMULARY MEDICATION, Medication Name: CPAP set to 9 cm water pressure with humidifier and supplies. Please fit for mask.  Dx OSA., Disp: 1 vial, Rfl: 0   fluticasone  (FLONASE ) 50 MCG/ACT nasal spray, Place 2 sprays into both nostrils daily., Disp: 16 g, Rfl: 5   Multiple Vitamins-Minerals (MENS MULTI VITAMIN & MINERAL PO), Take 1 tablet by mouth daily., Disp: , Rfl:    omeprazole  (PRILOSEC) 20 MG capsule, Take 1 capsule (20 mg total) by mouth daily., Disp: 90 capsule, Rfl: 3   sildenafil  (VIAGRA ) 100 MG tablet, Take 0.5-1 tablets (50-100 mg total) by mouth daily as needed for erectile dysfunction., Disp: 10 tablet, Rfl: 11   Medications ordered in this encounter:  Meds ordered this encounter  Medications   amoxicillin -clavulanate (AUGMENTIN ) 875-125 MG tablet    Sig: Take 1 tablet by mouth 2 (two) times daily.    Dispense:  14 tablet    Refill:  0    Supervising Provider:   BLAISE ALEENE KIDD [[id number]]     *If you need refills on other medications prior to your next appointment, please contact your pharmacy*  Follow-Up: Call back or seek an in-person evaluation  if the symptoms worsen or if the condition fails to improve as anticipated.  [facility] [phone]  Other Instructions Sinus Infection, Adult A sinus infection, also called sinusitis, is inflammation of your sinuses. Sinuses are hollow spaces in the bones around your face. Your sinuses are located: Around your eyes. In the middle of your forehead. Behind your nose. In your cheekbones. Mucus normally drains out of your sinuses. When your nasal tissues become inflamed or swollen, mucus can become trapped or blocked. This allows bacteria, viruses, and fungi to grow, which leads to infection. Most infections of the sinuses are caused by a virus. A sinus infection can develop quickly. It can last for up to 4 weeks (acute) or for more than 12 weeks (chronic). A sinus infection often develops after a cold. What are the causes? This condition is caused by anything that creates swelling in the sinuses or stops mucus from draining. This includes: Allergies. Asthma. Infection from bacteria or viruses. Deformities or blockages in your nose or sinuses. Abnormal growths in the nose (nasal polyps). Pollutants, such as chemicals or irritants in the air. Infection from fungi. This is rare. What increases the risk? You are more likely to develop this condition if you: Have a weak body defense system (immune system). Do a lot of swimming or diving. Overuse nasal sprays. Smoke. What are the signs or symptoms? The main symptoms of this condition are pain and a feeling of pressure around the affected sinuses. Other symptoms include: Stuffy nose or  congestion that makes it difficult to breathe through your nose. Thick yellow or greenish drainage from your nose. Tenderness, swelling, and warmth over the affected sinuses. A cough that may get worse at night. Decreased sense of smell and taste. Extra mucus that collects in the throat or the back of the nose (postnasal drip) causing a sore  throat or bad breath. Tiredness (fatigue). Fever. How is this diagnosed? This condition is diagnosed based on: Your symptoms. Your medical history. A physical exam. Tests to find out if your condition is acute or chronic. This may include: Checking your nose for nasal polyps. Viewing your sinuses using a device that has a light (endoscope). Testing for allergies or bacteria. Imaging tests, such as an MRI or CT scan. In rare cases, a bone biopsy may be done to rule out more serious types of fungal sinus disease. How is this treated? Treatment for a sinus infection depends on the cause and whether your condition is chronic or acute. If caused by a virus, your symptoms should go away on their own within 10 days. You may be given medicines to relieve symptoms. They include: Medicines that shrink swollen nasal passages (decongestants). A spray that eases inflammation of the nostrils (topical intranasal corticosteroids). Rinses that help get rid of thick mucus in your nose (nasal saline washes). Medicines that treat allergies (antihistamines). Over-the-counter pain relievers. If caused by bacteria, your health care provider may recommend waiting to see if your symptoms improve. Most bacterial infections will get better without antibiotic medicine. You may be given antibiotics if you have: A severe infection. A weak immune system. If caused by narrow nasal passages or nasal polyps, surgery may be needed. Follow these instructions at home: Medicines Take, use, or apply over-the-counter and prescription medicines only as told by your health care provider. These may include nasal sprays. If you were prescribed an antibiotic medicine, take it as told by your health care provider. Do not stop taking the antibiotic even if you start to feel better. Hydrate and humidify  Drink enough fluid to keep your urine pale yellow. Staying hydrated will help to thin your mucus. Use a cool mist humidifier to  keep the humidity level in your home above 50%. Inhale steam for 10-15 minutes, 3-4 times a day, or as told by your health care provider. You can do this in the bathroom while a hot shower is running. Limit your exposure to cool or dry air. Rest Rest as much as possible. Sleep with your head raised (elevated). Make sure you get enough sleep each night. General instructions  Apply a warm, moist washcloth to your face 3-4 times a day or as told by your health care provider. This will help with discomfort. Use nasal saline washes as often as told by your health care provider. Wash your hands often with soap and water to reduce your exposure to germs. If soap and water are not available, use hand sanitizer. Do not smoke. Avoid being around people who are smoking (secondhand smoke). Keep all follow-up visits. This is important. Contact a health care provider if: You have a fever. Your symptoms get worse. Your symptoms do not improve within 10 days. Get help right away if: You have a severe headache. You have persistent vomiting. You have severe pain or swelling around your face or eyes. You have vision problems. You develop confusion. Your neck is stiff. You have trouble breathing. These symptoms may be an emergency. Get help right away. Call 911. Do not  wait to see if the symptoms will go away. Do not drive yourself to the hospital. Summary A sinus infection is soreness and inflammation of your sinuses. Sinuses are hollow spaces in the bones around your face. This condition is caused by nasal tissues that become inflamed or swollen. The swelling traps or blocks the flow of mucus. This allows bacteria, viruses, and fungi to grow, which leads to infection. If you were prescribed an antibiotic medicine, take it as told by your health care provider. Do not stop taking the antibiotic even if you start to feel better. Keep all follow-up visits. This is important. This information is not  intended to replace advice given to you by your health care provider. Make sure you discuss any questions you have with your health care provider. Document Revised: 08/04/2021 Document Reviewed: 08/04/2021 Elsevier Patient Education  2024 [employer]   If you have been instructed to have an in-person evaluation today at a local Urgent Care facility, please use the link below. It will take you to a list of all of our available [facility], including address, phone number and hours of operation. Please do not delay care.  [facility]  If you or a family member do not have a primary care provider, use the link below to schedule a visit and establish care. When you choose a [facility] primary care physician or advanced practice provider, you gain a long-term partner in health. Find a Primary Care Provider  Learn more about [facility] in-office and virtual care options: [facility] - Get Care Now

## 2024-10-30 ENCOUNTER — Encounter: Admitting: Family Medicine
